# Patient Record
Sex: Female | Born: 1975 | Race: Black or African American | Hispanic: No | Smoking: Never smoker
Health system: Southern US, Community
[De-identification: ages and names within clinical notes are randomized; demographics above are authoritative.]

## PROBLEM LIST (undated history)

## (undated) DIAGNOSIS — D649 Anemia, unspecified: Secondary | ICD-10-CM

## (undated) DIAGNOSIS — Z789 Other specified health status: Secondary | ICD-10-CM

## (undated) HISTORY — DX: Anemia, unspecified: D64.9

## (undated) HISTORY — PX: COSMETIC SURGERY: SHX468

---

## 1997-08-18 HISTORY — PX: FOOT SURGERY: SHX648

## 1998-05-19 ENCOUNTER — Emergency Department (HOSPITAL_COMMUNITY): Admission: EM | Admit: 1998-05-19 | Discharge: 1998-05-19 | Payer: Self-pay | Admitting: Emergency Medicine

## 1998-07-07 ENCOUNTER — Emergency Department (HOSPITAL_COMMUNITY): Admission: EM | Admit: 1998-07-07 | Discharge: 1998-07-07 | Payer: Self-pay | Admitting: Emergency Medicine

## 1999-03-20 ENCOUNTER — Emergency Department (HOSPITAL_COMMUNITY): Admission: EM | Admit: 1999-03-20 | Discharge: 1999-03-20 | Payer: Self-pay | Admitting: Emergency Medicine

## 1999-10-22 ENCOUNTER — Other Ambulatory Visit: Admission: RE | Admit: 1999-10-22 | Discharge: 1999-10-22 | Payer: Self-pay | Admitting: Gynecology

## 2000-03-28 ENCOUNTER — Encounter: Payer: Self-pay | Admitting: Emergency Medicine

## 2000-03-28 ENCOUNTER — Emergency Department (HOSPITAL_COMMUNITY): Admission: EM | Admit: 2000-03-28 | Discharge: 2000-03-28 | Payer: Self-pay | Admitting: Emergency Medicine

## 2000-12-22 ENCOUNTER — Other Ambulatory Visit: Admission: RE | Admit: 2000-12-22 | Discharge: 2000-12-22 | Payer: Self-pay | Admitting: Obstetrics and Gynecology

## 2000-12-22 ENCOUNTER — Encounter (INDEPENDENT_AMBULATORY_CARE_PROVIDER_SITE_OTHER): Payer: Self-pay

## 2002-07-05 ENCOUNTER — Emergency Department (HOSPITAL_COMMUNITY): Admission: EM | Admit: 2002-07-05 | Discharge: 2002-07-05 | Payer: Self-pay | Admitting: Emergency Medicine

## 2002-08-25 ENCOUNTER — Encounter: Payer: Self-pay | Admitting: Emergency Medicine

## 2002-08-25 ENCOUNTER — Emergency Department (HOSPITAL_COMMUNITY): Admission: EM | Admit: 2002-08-25 | Discharge: 2002-08-25 | Payer: Self-pay | Admitting: Emergency Medicine

## 2002-09-03 ENCOUNTER — Encounter: Payer: Self-pay | Admitting: Chiropractor

## 2002-09-03 ENCOUNTER — Ambulatory Visit (HOSPITAL_COMMUNITY): Admission: RE | Admit: 2002-09-03 | Discharge: 2002-09-03 | Payer: Self-pay | Admitting: Chiropractor

## 2004-09-08 ENCOUNTER — Emergency Department (HOSPITAL_COMMUNITY): Admission: EM | Admit: 2004-09-08 | Discharge: 2004-09-08 | Payer: Self-pay | Admitting: Emergency Medicine

## 2005-03-06 ENCOUNTER — Other Ambulatory Visit: Admission: RE | Admit: 2005-03-06 | Discharge: 2005-03-06 | Payer: Self-pay | Admitting: Obstetrics and Gynecology

## 2009-04-12 ENCOUNTER — Emergency Department (HOSPITAL_BASED_OUTPATIENT_CLINIC_OR_DEPARTMENT_OTHER): Admission: EM | Admit: 2009-04-12 | Discharge: 2009-04-12 | Payer: Self-pay | Admitting: Emergency Medicine

## 2012-05-14 ENCOUNTER — Emergency Department (HOSPITAL_BASED_OUTPATIENT_CLINIC_OR_DEPARTMENT_OTHER)
Admission: EM | Admit: 2012-05-14 | Discharge: 2012-05-14 | Disposition: A | Discharge status: Home / Self Care | Payer: Self-pay | Attending: Emergency Medicine | Admitting: Emergency Medicine

## 2012-05-14 ENCOUNTER — Encounter (HOSPITAL_BASED_OUTPATIENT_CLINIC_OR_DEPARTMENT_OTHER): Payer: Self-pay | Admitting: *Deleted

## 2012-05-14 DIAGNOSIS — M549 Dorsalgia, unspecified: Secondary | ICD-10-CM | POA: Insufficient documentation

## 2012-05-14 MED ORDER — NAPROXEN 500 MG PO TABS: 500.0000 mg | ORAL_TABLET | Freq: Two times a day (BID) | ORAL | Status: DC

## 2012-05-14 MED ORDER — DIAZEPAM 5 MG PO TABS: ORAL_TABLET | ORAL | Status: DC

## 2012-05-14 MED ORDER — HYDROCODONE-ACETAMINOPHEN 5-325 MG PO TABS: ORAL_TABLET | ORAL | Status: DC

## 2012-05-14 MED ORDER — HYDROCODONE-ACETAMINOPHEN 5-325 MG PO TABS
2.0000 | ORAL_TABLET | Freq: Once | ORAL | Status: AC
  Administered 2012-05-14: 2 via ORAL
  Filled 2012-05-14: qty 2

## 2012-05-14 MED ORDER — DIAZEPAM 5 MG PO TABS
5.0000 mg | ORAL_TABLET | Freq: Once | ORAL | Status: AC
  Administered 2012-05-14: 5 mg via ORAL
  Filled 2012-05-14: qty 1

## 2012-05-14 NOTE — ED Notes (Signed)
Back pain. Running from a bird that got into her house this am and hurt her lower back.

## 2012-05-14 NOTE — ED Notes (Signed)
Pt was able to transfer herself from w/c to stretcher.
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
## 2012-05-17 NOTE — ED Provider Notes (Signed)
Medical screening examination/treatment/procedure(s) were performed by non-physician practitioner and as supervising physician I was immediately available for consultation/collaboration.  Geoffery Lyons, MD 05/17/12 1057

## 2013-09-06 ENCOUNTER — Encounter (HOSPITAL_COMMUNITY): Payer: Self-pay | Admitting: Pharmacist

## 2013-09-12 NOTE — H&P (Signed)
Tara Tate  DICTATION # [id number] CSN# [id number]   Margarette Asal, MD 09/12/2013 12:52 PM

## 2013-09-13 ENCOUNTER — Encounter (HOSPITAL_COMMUNITY)
Admission: RE | Admit: 2013-09-13 | Discharge: 2013-09-13 | Disposition: A | Discharge status: Home / Self Care | Payer: BC Managed Care – PPO | Source: Ambulatory Visit | Attending: Obstetrics and Gynecology | Admitting: Obstetrics and Gynecology

## 2013-09-13 ENCOUNTER — Encounter (INDEPENDENT_AMBULATORY_CARE_PROVIDER_SITE_OTHER): Payer: Self-pay

## 2013-09-13 ENCOUNTER — Encounter (HOSPITAL_COMMUNITY): Payer: Self-pay

## 2013-09-13 DIAGNOSIS — Z01810 Encounter for preprocedural cardiovascular examination: Secondary | ICD-10-CM | POA: Insufficient documentation

## 2013-09-13 DIAGNOSIS — Z01818 Encounter for other preprocedural examination: Secondary | ICD-10-CM | POA: Insufficient documentation

## 2013-09-13 HISTORY — DX: Other specified health status: Z78.9

## 2013-09-13 LAB — CBC
HEMATOCRIT: 34.1 % — AB (ref 36.0–46.0)
Hemoglobin: 11.4 g/dL — ABNORMAL LOW (ref 12.0–15.0)
MCH: 27 pg (ref 26.0–34.0)
MCHC: 33.4 g/dL (ref 30.0–36.0)
MCV: 80.6 fL (ref 78.0–100.0)
Platelets: 301 10*3/uL (ref 150–400)
RBC: 4.23 MIL/uL (ref 3.87–5.11)
RDW: 14.1 % (ref 11.5–15.5)
WBC: 5.3 10*3/uL (ref 4.0–10.5)

## 2013-09-13 NOTE — H&P (Signed)
NAMEBERNETTE, SEEMAN NO.:  [id number]  MEDICAL RECORD NO.:  [id number]  LOCATION:  PERIO                         FACILITY:  [facility]  PHYSICIAN:  Ralene Bathe. Matthew Saras, M.D.DATE OF BIRTH:  03-25-1976  DATE OF ADMISSION:  08/23/2013 DATE OF DISCHARGE:                             HISTORY & PHYSICAL   CHIEF COMPLAINT:  Pelvic pain, leiomyoma.  HISTORY OF PRESENT ILLNESS:  A 38 year old G6, P0, currently using condoms, I last saw this patient, began experiencing increased pelvic and back pain.  Part of her evaluation was felt to have irregularity of her uterus and had an ultrasound in our office, dated June 22, 2013, that showed a solitary 7 x 6.5 cm intramural fibroid, abutting the endometrium.  Ovaries appeared to be unremarkable.  No other significant fibroids were noted.  She presents at this time for myomectomy.  This procedure including specific risks related to bleeding, infection, adjacent organ injury, wound infection, phlebitis along with her expected recovery time discussed.  Concerns about fertility and the need for cesarean-section delivery, post myomectomy have all been discussed with her.  PAST MEDICAL HISTORY:  Allergies:  None. Current Medications:  Advil p.r.n.  REVIEW OF SYSTEMS:  Significant for headache, anemia, UTI.  FAMILY HISTORY:  Significant for heart disease, headache, anemia, epilepsy, UTI, diabetes, and hypertension.  PAST SURGICAL HISTORY:  None.  SOCIAL HISTORY:  Occasional alcohol use.  Denies drug or tobacco use. She is single.  Last Pap in October 2014, was normal.  PHYSICAL EXAMINATION:  VITAL SIGNS:  Temp 98.2, blood pressure 120/72. HEENT:  Unremarkable. NECK:  Supple without masses. LUNGS:  Clear. CARDIOVASCULAR:  Regular rate and rhythm without murmurs, rubs, or gallops noted. BREASTS:  Without masses. ABDOMEN:  Soft, flat, nontender. PELVIC:  Vulva, vagina, and cervix normal.  Uterus upper limit of normal size,  slightly tender on the left. EXTREMITIES:  Unremarkable. NEUROLOGIC:  Unremarkable.  IMPRESSION:  Solitary leiomyomas noted on ultrasound.  PLAN:  Myomectomy procedure and risks discussed as above.     Viktoria Gruetzmacher M. Matthew Saras, M.D.     [facility]/MEDQ  D:  09/12/2013  T:  09/13/2013  Job:  [id number]

## 2013-09-13 NOTE — Patient Instructions (Signed)
20 Tara Tate  09/13/2013   Your procedure is scheduled on:  09/19/13  Enter through the Main Entrance of [facility] at [place] up the phone at the desk and dial [phone].   Call this number if you have problems the morning of surgery: [phone]   Remember:   Do not eat food:After Midnight.  Do not drink clear liquids: After Midnight.  Take these medicines the morning of surgery with A SIP OF WATER: NA   Do not wear jewelry, make-up or nail polish.  Do not wear lotions, powders, or perfumes. You may wear deodorant.  Do not shave 48 hours prior to surgery.  Do not bring valuables to the hospital.  [facility] is not   responsible for any belongings or valuables brought to the hospital.  Contacts, dentures or bridgework may not be worn into surgery.  Leave suitcase in the car. After surgery it may be brought to your room.  For patients admitted to the hospital, checkout time is 11:00 AM the day of              discharge.   Patients discharged the day of surgery will not be allowed to drive             home.  Name and phone number of your driver: NA  Special Instructions:   Shower using CHG 2 nights before surgery and the night before surgery.  If you shower the day of surgery use CHG.  Use special wash - you have one bottle of CHG for all showers.  You should use approximately 1/3 of the bottle for each shower.   Please read over the following fact sheets that you were given:   Surgical Site Infection Prevention

## 2013-09-18 MED ORDER — CEFOTETAN DISODIUM 2 G IJ SOLR
2.0000 g | INTRAMUSCULAR | Status: AC
  Administered 2013-09-19: 2 g via INTRAVENOUS
  Filled 2013-09-18: qty 2

## 2013-09-18 NOTE — Anesthesia Preprocedure Evaluation (Addendum)
Anesthesia Evaluation  Patient identified by MRN, date of birth, ID band Patient awake    Reviewed: Allergy & Precautions, H&P , NPO status , Patient's Chart, lab work & pertinent test results  Airway Mallampati: II TM Distance: >3 FB Neck ROM: Full    Dental  (+) Teeth Intact and Dental Advisory Given   Pulmonary neg pulmonary ROS,  breath sounds clear to auscultation        Cardiovascular negative cardio ROS  Rhythm:Regular Rate:Normal     Neuro/Psych negative neurological ROS  negative psych ROS   GI/Hepatic negative GI ROS, Neg liver ROS,   Endo/Other  negative endocrine ROS  Renal/GU negative Renal ROS     Musculoskeletal negative musculoskeletal ROS (+)   Abdominal (+) + obese,   Peds  Hematology negative hematology ROS (+)   Anesthesia Other Findings   Reproductive/Obstetrics negative OB ROS                          Anesthesia Physical Anesthesia Plan  ASA: II  Anesthesia Plan: General   Post-op Pain Management:    Induction: Intravenous  Airway Management Planned: Oral ETT  Additional Equipment:   Intra-op Plan:   Post-operative Plan: Extubation in OR  Informed Consent: I have reviewed the patients History and Physical, chart, labs and discussed the procedure including the risks, benefits and alternatives for the proposed anesthesia with the patient or authorized representative who has indicated his/her understanding and acceptance.   Dental advisory given  Plan Discussed with: CRNA  Anesthesia Plan Comments:         Anesthesia Quick Evaluation

## 2013-09-19 ENCOUNTER — Encounter (HOSPITAL_COMMUNITY)
Admission: RE | Disposition: A | Discharge status: Home / Self Care | Payer: Self-pay | Source: Ambulatory Visit | Attending: Obstetrics and Gynecology

## 2013-09-19 ENCOUNTER — Encounter (HOSPITAL_COMMUNITY): Payer: BC Managed Care – PPO | Admitting: Anesthesiology

## 2013-09-19 ENCOUNTER — Inpatient Hospital Stay (HOSPITAL_COMMUNITY): Payer: BC Managed Care – PPO | Admitting: Anesthesiology

## 2013-09-19 ENCOUNTER — Encounter (HOSPITAL_COMMUNITY): Payer: Self-pay | Admitting: *Deleted

## 2013-09-19 ENCOUNTER — Observation Stay (HOSPITAL_COMMUNITY)
Admission: RE | Admit: 2013-09-19 | Discharge: 2013-09-21 | Disposition: A | Discharge status: Home / Self Care | Payer: BC Managed Care – PPO | Source: Ambulatory Visit | Attending: Obstetrics and Gynecology | Admitting: Obstetrics and Gynecology

## 2013-09-19 DIAGNOSIS — D251 Intramural leiomyoma of uterus: Secondary | ICD-10-CM | POA: Insufficient documentation

## 2013-09-19 DIAGNOSIS — N949 Unspecified condition associated with female genital organs and menstrual cycle: Principal | ICD-10-CM | POA: Insufficient documentation

## 2013-09-19 DIAGNOSIS — D219 Benign neoplasm of connective and other soft tissue, unspecified: Secondary | ICD-10-CM | POA: Diagnosis present

## 2013-09-19 HISTORY — PX: MYOMECTOMY: SHX85

## 2013-09-19 LAB — ABO/RH: ABO/RH(D): O POS

## 2013-09-19 LAB — TYPE AND SCREEN
ABO/RH(D): O POS
Antibody Screen: NEGATIVE

## 2013-09-19 LAB — PREGNANCY, URINE: Preg Test, Ur: NEGATIVE

## 2013-09-19 SURGERY — MYOMECTOMY, ABDOMINAL APPROACH
Anesthesia: General

## 2013-09-19 MED ORDER — BUPIVACAINE LIPOSOME 1.3 % IJ SUSP
20.0000 mL | Freq: Once | INTRAMUSCULAR | Status: AC
  Administered 2013-09-19: 20 mL
  Filled 2013-09-19: qty 20

## 2013-09-19 MED ORDER — HYDROMORPHONE HCL PF 1 MG/ML IJ SOLN
0.2500 mg | INTRAMUSCULAR | Status: DC | PRN
  Administered 2013-09-19 (×2): 0.5 mg via INTRAVENOUS

## 2013-09-19 MED ORDER — NEOSTIGMINE METHYLSULFATE 1 MG/ML IJ SOLN
INTRAMUSCULAR | Status: DC | PRN
  Administered 2013-09-19: 4 mg via INTRAVENOUS

## 2013-09-19 MED ORDER — ROCURONIUM BROMIDE 100 MG/10ML IV SOLN
INTRAVENOUS | Status: DC | PRN
  Administered 2013-09-19: 10 mg via INTRAVENOUS
  Administered 2013-09-19: 40 mg via INTRAVENOUS

## 2013-09-19 MED ORDER — PROMETHAZINE HCL 25 MG/ML IJ SOLN
INTRAMUSCULAR | Status: AC
  Filled 2013-09-19: qty 1

## 2013-09-19 MED ORDER — ONDANSETRON HCL 4 MG PO TABS: 4.0000 mg | ORAL_TABLET | Freq: Four times a day (QID) | ORAL | Status: DC | PRN

## 2013-09-19 MED ORDER — MORPHINE SULFATE (PF) 1 MG/ML IV SOLN
INTRAVENOUS | Status: DC
  Administered 2013-09-19: 10:00:00 via INTRAVENOUS
  Administered 2013-09-19: 18 mL via INTRAVENOUS
  Filled 2013-09-19: qty 25

## 2013-09-19 MED ORDER — SODIUM CHLORIDE 0.9 % IJ SOLN: 9.0000 mL | INTRAMUSCULAR | Status: DC | PRN

## 2013-09-19 MED ORDER — MIDAZOLAM HCL 2 MG/2ML IJ SOLN
INTRAMUSCULAR | Status: DC | PRN
  Administered 2013-09-19: 2 mg via INTRAVENOUS

## 2013-09-19 MED ORDER — GLYCOPYRROLATE 0.2 MG/ML IJ SOLN
INTRAMUSCULAR | Status: DC | PRN
  Administered 2013-09-19: .8 mg via INTRAVENOUS

## 2013-09-19 MED ORDER — FENTANYL CITRATE 0.05 MG/ML IJ SOLN
INTRAMUSCULAR | Status: AC
  Filled 2013-09-19: qty 5

## 2013-09-19 MED ORDER — OXYCODONE-ACETAMINOPHEN 5-325 MG PO TABS
1.0000 | ORAL_TABLET | ORAL | Status: DC | PRN
  Administered 2013-09-20 (×4): 1 via ORAL
  Administered 2013-09-21: 2 via ORAL
  Filled 2013-09-19 (×3): qty 1
  Filled 2013-09-19: qty 2
  Filled 2013-09-19: qty 1

## 2013-09-19 MED ORDER — KETOROLAC TROMETHAMINE 30 MG/ML IJ SOLN: 30.0000 mg | Freq: Four times a day (QID) | INTRAMUSCULAR | Status: DC

## 2013-09-19 MED ORDER — SODIUM CHLORIDE 0.9 % IJ SOLN
INTRAMUSCULAR | Status: AC
  Filled 2013-09-19: qty 10

## 2013-09-19 MED ORDER — DEXAMETHASONE SODIUM PHOSPHATE 10 MG/ML IJ SOLN
INTRAMUSCULAR | Status: DC | PRN
  Administered 2013-09-19: 10 mg via INTRAVENOUS

## 2013-09-19 MED ORDER — DEXAMETHASONE SODIUM PHOSPHATE 10 MG/ML IJ SOLN
INTRAMUSCULAR | Status: AC
  Filled 2013-09-19: qty 1

## 2013-09-19 MED ORDER — ONDANSETRON HCL 4 MG/2ML IJ SOLN: 4.0000 mg | Freq: Four times a day (QID) | INTRAMUSCULAR | Status: DC | PRN

## 2013-09-19 MED ORDER — BUTORPHANOL TARTRATE 1 MG/ML IJ SOLN: 1.0000 mg | INTRAMUSCULAR | Status: DC | PRN

## 2013-09-19 MED ORDER — PROMETHAZINE HCL 25 MG/ML IJ SOLN
6.2500 mg | INTRAMUSCULAR | Status: DC | PRN
  Administered 2013-09-19: 6.25 mg via INTRAVENOUS

## 2013-09-19 MED ORDER — DIPHENHYDRAMINE HCL 12.5 MG/5ML PO ELIX: 12.5000 mg | ORAL_SOLUTION | Freq: Four times a day (QID) | ORAL | Status: DC | PRN

## 2013-09-19 MED ORDER — METHYLENE BLUE 1 % INJ SOLN
INTRAMUSCULAR | Status: AC
  Filled 2013-09-19: qty 10

## 2013-09-19 MED ORDER — DIPHENHYDRAMINE HCL 50 MG/ML IJ SOLN
12.5000 mg | Freq: Four times a day (QID) | INTRAMUSCULAR | Status: DC | PRN
Start: 2013-09-19 — End: 2013-09-20

## 2013-09-19 MED ORDER — LIDOCAINE HCL (CARDIAC) 20 MG/ML IV SOLN
INTRAVENOUS | Status: DC | PRN
  Administered 2013-09-19: 50 mg via INTRAVENOUS

## 2013-09-19 MED ORDER — NALOXONE HCL 0.4 MG/ML IJ SOLN: 0.4000 mg | INTRAMUSCULAR | Status: DC | PRN

## 2013-09-19 MED ORDER — SODIUM CHLORIDE 0.9 % IJ SOLN
INTRAMUSCULAR | Status: AC
  Filled 2013-09-19: qty 100

## 2013-09-19 MED ORDER — PROPOFOL 10 MG/ML IV EMUL
INTRAVENOUS | Status: AC
  Filled 2013-09-19: qty 20

## 2013-09-19 MED ORDER — LIDOCAINE HCL (CARDIAC) 20 MG/ML IV SOLN
INTRAVENOUS | Status: AC
  Filled 2013-09-19: qty 5

## 2013-09-19 MED ORDER — DIPHENHYDRAMINE HCL 50 MG/ML IJ SOLN: 12.5000 mg | Freq: Four times a day (QID) | INTRAMUSCULAR | Status: DC | PRN

## 2013-09-19 MED ORDER — IBUPROFEN 800 MG PO TABS
800.0000 mg | ORAL_TABLET | Freq: Three times a day (TID) | ORAL | Status: DC | PRN
  Administered 2013-09-20 – 2013-09-21 (×3): 800 mg via ORAL
  Filled 2013-09-19 (×3): qty 1

## 2013-09-19 MED ORDER — KETOROLAC TROMETHAMINE 30 MG/ML IJ SOLN
30.0000 mg | Freq: Four times a day (QID) | INTRAMUSCULAR | Status: DC
  Administered 2013-09-19 – 2013-09-20 (×3): 30 mg via INTRAVENOUS
  Filled 2013-09-19 (×4): qty 1

## 2013-09-19 MED ORDER — LACTATED RINGERS IV SOLN: INTRAVENOUS | Status: DC | PRN

## 2013-09-19 MED ORDER — VASOPRESSIN 20 UNIT/ML IJ SOLN
INTRAMUSCULAR | Status: AC
  Filled 2013-09-19: qty 1

## 2013-09-19 MED ORDER — MIDAZOLAM HCL 2 MG/2ML IJ SOLN: 0.5000 mg | Freq: Once | INTRAMUSCULAR | Status: DC | PRN

## 2013-09-19 MED ORDER — HYDROMORPHONE HCL PF 1 MG/ML IJ SOLN
INTRAMUSCULAR | Status: AC
  Filled 2013-09-19: qty 1

## 2013-09-19 MED ORDER — KETOROLAC TROMETHAMINE 30 MG/ML IJ SOLN: 30.0000 mg | Freq: Once | INTRAMUSCULAR | Status: DC

## 2013-09-19 MED ORDER — LACTATED RINGERS IV SOLN
INTRAVENOUS | Status: DC
  Administered 2013-09-19 (×3): via INTRAVENOUS

## 2013-09-19 MED ORDER — HYDROMORPHONE HCL PF 1 MG/ML IJ SOLN
INTRAMUSCULAR | Status: AC
  Administered 2013-09-19: 0.5 mg via INTRAVENOUS
  Filled 2013-09-19: qty 1

## 2013-09-19 MED ORDER — BUPIVACAINE HCL (PF) 0.5 % IJ SOLN
INTRAMUSCULAR | Status: AC
  Filled 2013-09-19: qty 30

## 2013-09-19 MED ORDER — FENTANYL CITRATE 0.05 MG/ML IJ SOLN
INTRAMUSCULAR | Status: DC | PRN
  Administered 2013-09-19: 150 ug via INTRAVENOUS
  Administered 2013-09-19 (×2): 100 ug via INTRAVENOUS

## 2013-09-19 MED ORDER — ZOLPIDEM TARTRATE 5 MG PO TABS
5.0000 mg | ORAL_TABLET | Freq: Every evening | ORAL | Status: DC | PRN
  Administered 2013-09-20: 5 mg via ORAL
  Filled 2013-09-19: qty 1

## 2013-09-19 MED ORDER — ONDANSETRON HCL 4 MG/2ML IJ SOLN
INTRAMUSCULAR | Status: AC
  Filled 2013-09-19: qty 2

## 2013-09-19 MED ORDER — DEXTROSE IN LACTATED RINGERS 5 % IV SOLN
INTRAVENOUS | Status: DC
  Administered 2013-09-19: 13:00:00 via INTRAVENOUS

## 2013-09-19 MED ORDER — KETOROLAC TROMETHAMINE 30 MG/ML IJ SOLN
INTRAMUSCULAR | Status: DC | PRN
  Administered 2013-09-19: 30 mg via INTRAVENOUS

## 2013-09-19 MED ORDER — VASOPRESSIN 20 UNIT/ML IJ SOLN
INTRAVENOUS | Status: DC | PRN
  Administered 2013-09-19: 08:00:00 via INTRAMUSCULAR

## 2013-09-19 MED ORDER — ONDANSETRON HCL 4 MG/2ML IJ SOLN
INTRAMUSCULAR | Status: DC | PRN
  Administered 2013-09-19: 4 mg via INTRAVENOUS

## 2013-09-19 MED ORDER — MIDAZOLAM HCL 2 MG/2ML IJ SOLN
INTRAMUSCULAR | Status: AC
  Filled 2013-09-19: qty 2

## 2013-09-19 MED ORDER — PROPOFOL 10 MG/ML IV BOLUS
INTRAVENOUS | Status: DC | PRN
  Administered 2013-09-19: 180 mg via INTRAVENOUS

## 2013-09-19 MED ORDER — KETOROLAC TROMETHAMINE 30 MG/ML IJ SOLN: 15.0000 mg | Freq: Once | INTRAMUSCULAR | Status: DC | PRN

## 2013-09-19 MED ORDER — MORPHINE SULFATE (PF) 1 MG/ML IV SOLN
INTRAVENOUS | Status: DC
  Administered 2013-09-19: 5.51 mg via INTRAVENOUS
  Administered 2013-09-19: 1.5 mL via INTRAVENOUS
  Administered 2013-09-19: 7.5 mg via INTRAVENOUS
  Administered 2013-09-20: 9 mg via INTRAVENOUS
  Filled 2013-09-19: qty 25

## 2013-09-19 MED ORDER — MORPHINE SULFATE (PF) 1 MG/ML IV SOLN: INTRAVENOUS | Status: DC

## 2013-09-19 MED ORDER — MEPERIDINE HCL 25 MG/ML IJ SOLN: 6.2500 mg | INTRAMUSCULAR | Status: DC | PRN

## 2013-09-19 MED ORDER — MENTHOL 3 MG MT LOZG: 1.0000 | LOZENGE | OROMUCOSAL | Status: DC | PRN

## 2013-09-19 SURGICAL SUPPLY — 41 items
BARRIER ADHS 3X4 INTERCEED (GAUZE/BANDAGES/DRESSINGS) ×2 IMPLANT
BENZOIN TINCTURE PRP APPL 2/3 (GAUZE/BANDAGES/DRESSINGS) ×2 IMPLANT
CANISTER SUCT 3000ML (MISCELLANEOUS) ×2 IMPLANT
CATH FOLEY 2WAY  3CC  8FR (CATHETERS)
CATH FOLEY 2WAY 3CC 8FR (CATHETERS) IMPLANT
CATH KIT ON Q 5IN DUAL SLV (PAIN MANAGEMENT) IMPLANT
CLOTH BEACON ORANGE TIMEOUT ST (SAFETY) ×2 IMPLANT
CONT PATH 16OZ SNAP LID 3702 (MISCELLANEOUS) ×2 IMPLANT
DECANTER SPIKE VIAL GLASS SM (MISCELLANEOUS) ×2 IMPLANT
DRSG OPSITE POSTOP 4X10 (GAUZE/BANDAGES/DRESSINGS) ×2 IMPLANT
DRSG TEGADERM 2.38X2.75 (GAUZE/BANDAGES/DRESSINGS) IMPLANT
FILTER STRAW FLUID ASPIR (MISCELLANEOUS) IMPLANT
GAUZE SPONGE 4X4 16PLY XRAY LF (GAUZE/BANDAGES/DRESSINGS) ×4 IMPLANT
GLOVE BIO SURGEON STRL SZ7 (GLOVE) ×2 IMPLANT
GOWN STRL REUS W/TWL LRG LVL3 (GOWN DISPOSABLE) ×6 IMPLANT
IV STOPCOCK 4 WAY 40  W/Y SET (IV SOLUTION)
IV STOPCOCK 4 WAY 40 W/Y SET (IV SOLUTION) IMPLANT
NEEDLE HYPO 25X1 1.5 SAFETY (NEEDLE) ×2 IMPLANT
PACK ABDOMINAL GYN (CUSTOM PROCEDURE TRAY) ×2 IMPLANT
PAD OB MATERNITY 4.3X12.25 (PERSONAL CARE ITEMS) ×2 IMPLANT
RINGERS IRRIG 1000ML POUR BTL (IV SOLUTION) ×2 IMPLANT
RTRCTR WOUND ALEXIS 18CM SML (INSTRUMENTS) ×2
SAVER CELL AAL HAEMONETICS (INSTRUMENTS) ×1 IMPLANT
SPONGE GAUZE 4X4 12PLY STER LF (GAUZE/BANDAGES/DRESSINGS) ×4 IMPLANT
STRIP CLOSURE SKIN 1/2X4 (GAUZE/BANDAGES/DRESSINGS) ×2 IMPLANT
STRIP CLOSURE SKIN 1/4X3 (GAUZE/BANDAGES/DRESSINGS) ×2 IMPLANT
SUT MNCRL+ AB 3-0 CT1 36 (SUTURE) IMPLANT
SUT MON AB 3-0 SH 27 (SUTURE)
SUT MON AB 3-0 SH27 (SUTURE) IMPLANT
SUT MON AB 4-0 PS1 27 (SUTURE) ×2 IMPLANT
SUT MONOCRYL AB 3-0 CT1 36IN (SUTURE)
SUT PDS AB 0 CT1 27 (SUTURE) ×4 IMPLANT
SUT VIC AB 2-0 CT1 27 (SUTURE) ×4
SUT VIC AB 2-0 CT1 TAPERPNT 27 (SUTURE) ×2 IMPLANT
SYR 3ML 22GX1 1/2 SAFETY (SYRINGE) IMPLANT
SYR 50ML LL SCALE MARK (SYRINGE) IMPLANT
SYR 5ML LL (SYRINGE) ×2 IMPLANT
SYR CONTROL 10ML LL (SYRINGE) ×2 IMPLANT
TOWEL OR 17X24 6PK STRL BLUE (TOWEL DISPOSABLE) ×4 IMPLANT
TRAY FOLEY CATH 14FR (SET/KITS/TRAYS/PACK) ×2 IMPLANT
WATER STERILE IRR 1000ML POUR (IV SOLUTION) ×2 IMPLANT

## 2013-09-19 NOTE — Progress Notes (Signed)
Made introductions with pt and let her know of spiritual care services.  She has good support from family and is does not report any needs at this time.  [employer] Pager, [phone] 2:25 PM   09/19/13 1400  Clinical Encounter Type  Visited With Patient and family together  Visit Type Initial

## 2013-09-19 NOTE — Transfer of Care (Signed)
Immediate Anesthesia Transfer of Care Note  Patient: Tara Tate  Procedure(s) Performed: Procedure(s): MYOMECTOMY (N/A)  Patient Location: PACU  Anesthesia Type:General  Level of Consciousness: awake  Airway & Oxygen Therapy: Patient Spontanous Breathing  Post-op Assessment: Report given to PACU RN  Post vital signs: stable  Filed Vitals:   09/19/13 0615  BP: 121/73  Pulse: 66  Temp: 36.8 C  Resp: 18    Complications: No apparent anesthesia complications

## 2013-09-19 NOTE — Progress Notes (Signed)
The patient was re-examined with no change in status 

## 2013-09-19 NOTE — Anesthesia Postprocedure Evaluation (Signed)
  Anesthesia Post Note  Patient: Tara Tate  Procedure(s) Performed: Procedure(s) (LRB): MYOMECTOMY (N/A)  Anesthesia type: GA  Patient location: PACU  Post pain: Pain level controlled  Post assessment: Post-op Vital signs reviewed  Last Vitals:  Filed Vitals:   09/19/13 0930  BP:   Pulse:   Temp: 36.4 C  Resp:     Post vital signs: Reviewed  Level of consciousness: sedated  Complications: No apparent anesthesia complications

## 2013-09-19 NOTE — Anesthesia Postprocedure Evaluation (Signed)
  Anesthesia Post-op Note  Patient: Tara Tate  Procedure(s) Performed: Procedure(s): MYOMECTOMY (N/A)  Patient Location: Women's Unit  Anesthesia Type:General  Level of Consciousness: awake, alert  and oriented  Airway and Oxygen Therapy: Patient Spontanous Breathing and Patient connected to nasal cannula oxygen  Post-op Pain: none  Post-op Assessment: Post-op Vital signs reviewed and Patient's Cardiovascular Status Stable  Post-op Vital Signs: Reviewed and stable  Complications: No apparent anesthesia complications

## 2013-09-19 NOTE — Op Note (Signed)
Preoperative diagnosis: Symptomatic leiomyoma, pelvic pain  Postoperative diagnosis: Same   Procedure: Laparotomy with myomectomy  Surgeon: Matthew Saras  Assistant: Morris  EBL: 150 cc  Specimens removed: Solitary fibroid, to pathology  Procedure and findings:  The patient taken the operating room after an adequate level of general anesthesia was obtained with the patient in the supine position the abdomen prepped and draped in the usual fashion, Foley catheter positioned. Appropriate timeout taken at that point.  Penicillus is was made 2 finger breaths above the symphysis carried down to the fascia which was incised and extended transversely. Rectus muscles were divided in the midline, peritoneum entered superiorly without incident and extended in a vertical fashion. Alexis retractor was positioned the bowels were packed superiorly out of the field a large bilobed 9 cm fibroid at the fundus was noted bilateral tubes and ovaries cul-de-sac and anterior space were free and clear no other fibroids were noted.  A cockup was used to grasp the large fibroid to elevate the uterus out of the incision, dilute Pitressin solution was injected incision was made through the serosa the fibroid was then enucleated and removed. This did not appear 10 [place] on either tube nor on the endometrium is best I can tell. Thank you was superior to the cavity. Deeper tissue closed with 0 Monocryl suture interrupted. Serosa was then closed the running 4-0 Monocryl locked suture with excellent hemostasis. After irrigation, the operative site was noted be hemostatic Interceed was placed, prior to closure sponge, needle, instrument counts reported as correct x2. The retractor was removed. Peritoneum closed with a running 2-0 Vicryl suture. 0 PDS was then used from laterally to midline on either side to close the fascia undiluted Exparel was then injected into the subcutaneous tissue for postoperative analgesia. Subcutaneous tissue  was hemostatic a 4-0 Monocryl subcuticular closure with a honeycomb dressing. She tolerated this well went to recovery room in good condition.  Dictated with [facility]  Tara Borba M. Tara Heater.D.

## 2013-09-20 ENCOUNTER — Encounter (HOSPITAL_COMMUNITY): Payer: Self-pay | Admitting: Obstetrics and Gynecology

## 2013-09-20 LAB — CBC
HCT: 31.1 % — ABNORMAL LOW (ref 36.0–46.0)
Hemoglobin: 10.3 g/dL — ABNORMAL LOW (ref 12.0–15.0)
MCH: 26.5 pg (ref 26.0–34.0)
MCHC: 33.1 g/dL (ref 30.0–36.0)
MCV: 79.9 fL (ref 78.0–100.0)
PLATELETS: 344 10*3/uL (ref 150–400)
RBC: 3.89 MIL/uL (ref 3.87–5.11)
RDW: 14.2 % (ref 11.5–15.5)
WBC: 15.3 10*3/uL — ABNORMAL HIGH (ref 4.0–10.5)

## 2013-09-20 NOTE — Progress Notes (Signed)
1 Day Post-Op Procedure(s) (LRB): MYOMECTOMY (N/A)  Subjective: Patient reports tolerating PO.    Objective: I have reviewed patient's vital signs and labs. BP 107/68  Pulse 63  Temp(Src) 98.3 F (36.8 C) (Oral)  Resp 18  Ht 5\' 6"  (1.676 m)  Wt 94.802 kg (209 lb)  BMI 33.75 kg/m2  SpO2 97% CBC    Component Value Date/Time   WBC 15.3* 09/20/2013 0558   RBC 3.89 09/20/2013 0558   HGB 10.3* 09/20/2013 0558   HCT 31.1* 09/20/2013 0558   PLT 344 09/20/2013 0558   MCV 79.9 09/20/2013 0558   MCH 26.5 09/20/2013 0558   MCHC 33.1 09/20/2013 0558   RDW 14.2 09/20/2013 0558      abd soft + BS, Inc C/D  Assessment: s/p Procedure(s): MYOMECTOMY (N/A): stable  Plan: Advance diet Encourage ambulation Advance to PO medication Discontinue IV fluids  LOS: 1 day    Keeon Zurn M 09/20/2013, 8:54 AM

## 2013-09-21 MED ORDER — OXYCODONE-ACETAMINOPHEN 5-325 MG PO TABS: 1.0000 | ORAL_TABLET | ORAL | Status: DC | PRN

## 2013-09-21 MED ORDER — IBUPROFEN 800 MG PO TABS: 800.0000 mg | ORAL_TABLET | Freq: Three times a day (TID) | ORAL | Status: DC | PRN

## 2013-09-21 NOTE — Discharge Summary (Signed)
Physician Discharge Summary  Patient ID: Tara Tate MRN: [id number] DOB/AGE: 03/09/1976 38 y.o.  Admit date: 09/19/2013 Discharge date: 09/21/2013  Admission Ida pain/leiomyoma  Discharge Diagnoses: same Active Problems:   Leiomyoma   Discharged Condition: good  Hospital Course: adm for EL with solitary removal of large leiomyoma, DC on POD 2, afeb, tol PO , amb w/o probs  Consults: None  Significant Diagnostic Studies: labs:  CBC    Component Value Date/Time   WBC 15.3* 09/20/2013 0558   RBC 3.89 09/20/2013 0558   HGB 10.3* 09/20/2013 0558   HCT 31.1* 09/20/2013 0558   PLT 344 09/20/2013 0558   MCV 79.9 09/20/2013 0558   MCH 26.5 09/20/2013 0558   MCHC 33.1 09/20/2013 0558   RDW 14.2 09/20/2013 0558      Treatments: surgery: myomectomy  Discharge Exam: Blood pressure 114/74, pulse 72, temperature 98.3 F (36.8 C), temperature source Oral, resp. rate 16, height 5\' 6"  (1.676 m), weight 94.802 kg (209 lb), SpO2 97.00%. GI: soft, non-tender; bowel sounds normal; no masses,  no organomegaly  Inc C/D Disposition: 01-Home or Self Care     Medication List    STOP taking these medications       aspirin-acetaminophen-caffeine 250-250-65 MG per tablet  Commonly known as:  EXCEDRIN MIGRAINE     HYDROcodone-acetaminophen 5-325 MG per tablet  Commonly known as:  NORCO/VICODIN      TAKE these medications       clotrimazole-betamethasone cream  Commonly known as:  LOTRISONE  Apply 1 application topically daily. Apply to Hand for Rash.     ibuprofen 800 MG tablet  Commonly known as:  ADVIL,MOTRIN  Take 1 tablet (800 mg total) by mouth every 8 (eight) hours as needed (mild pain).     multivitamin with minerals Tabs tablet  Take 1 tablet by mouth daily. Uses Abcuts Smart Gummy     oxyCODONE-acetaminophen 5-325 MG per tablet  Commonly known as:  PERCOCET/ROXICET  Take 1-2 tablets by mouth every 4 (four) hours as needed for severe pain (moderate to severe pain (when  tolerating fluids)).     phentermine 37.5 MG capsule  Take 37.5 mg by mouth every morning.           Follow-up Information   Follow up with Margarette Asal, MD. Schedule an appointment as soon as possible for a visit in 1 week.   Specialty:  Obstetrics and Gynecology   Contact information:   [address] [place] [address] [phone]       Signed: Margarette Asal 09/21/2013, 7:53 AM

## 2014-06-26 ENCOUNTER — Other Ambulatory Visit: Payer: Self-pay | Admitting: Obstetrics and Gynecology

## 2014-06-27 LAB — CYTOLOGY - PAP

## 2016-09-22 DIAGNOSIS — Z0000 Encounter for general adult medical examination without abnormal findings: Secondary | ICD-10-CM | POA: Diagnosis not present

## 2016-09-25 DIAGNOSIS — M545 Low back pain: Secondary | ICD-10-CM | POA: Diagnosis not present

## 2016-09-25 DIAGNOSIS — M4316 Spondylolisthesis, lumbar region: Secondary | ICD-10-CM | POA: Diagnosis not present

## 2016-09-25 DIAGNOSIS — M4846XA Fatigue fracture of vertebra, lumbar region, initial encounter for fracture: Secondary | ICD-10-CM | POA: Diagnosis not present

## 2016-10-07 DIAGNOSIS — M4846XA Fatigue fracture of vertebra, lumbar region, initial encounter for fracture: Secondary | ICD-10-CM | POA: Diagnosis not present

## 2016-10-10 DIAGNOSIS — M545 Low back pain: Secondary | ICD-10-CM | POA: Diagnosis not present

## 2016-10-20 DIAGNOSIS — M545 Low back pain: Secondary | ICD-10-CM | POA: Diagnosis not present

## 2016-11-04 DIAGNOSIS — E669 Obesity, unspecified: Secondary | ICD-10-CM | POA: Diagnosis not present

## 2016-11-21 DIAGNOSIS — N898 Other specified noninflammatory disorders of vagina: Secondary | ICD-10-CM | POA: Diagnosis not present

## 2016-11-21 DIAGNOSIS — B9689 Other specified bacterial agents as the cause of diseases classified elsewhere: Secondary | ICD-10-CM | POA: Diagnosis not present

## 2016-11-21 DIAGNOSIS — N760 Acute vaginitis: Secondary | ICD-10-CM | POA: Diagnosis not present

## 2017-04-17 DIAGNOSIS — M62838 Other muscle spasm: Secondary | ICD-10-CM | POA: Diagnosis not present

## 2017-04-17 DIAGNOSIS — J069 Acute upper respiratory infection, unspecified: Secondary | ICD-10-CM | POA: Diagnosis not present

## 2017-05-27 DIAGNOSIS — F4322 Adjustment disorder with anxiety: Secondary | ICD-10-CM | POA: Diagnosis not present

## 2017-06-01 DIAGNOSIS — Z01419 Encounter for gynecological examination (general) (routine) without abnormal findings: Secondary | ICD-10-CM | POA: Diagnosis not present

## 2017-06-01 DIAGNOSIS — Z6834 Body mass index (BMI) 34.0-34.9, adult: Secondary | ICD-10-CM | POA: Diagnosis not present

## 2017-06-02 ENCOUNTER — Other Ambulatory Visit (HOSPITAL_COMMUNITY): Payer: Self-pay | Admitting: Obstetrics and Gynecology

## 2017-06-02 DIAGNOSIS — Z3141 Encounter for fertility testing: Secondary | ICD-10-CM

## 2017-06-10 ENCOUNTER — Ambulatory Visit (HOSPITAL_COMMUNITY)
Admission: RE | Admit: 2017-06-10 | Discharge: 2017-06-10 | Disposition: A | Discharge status: Home / Self Care | Payer: BLUE CROSS/BLUE SHIELD | Source: Ambulatory Visit | Attending: Obstetrics and Gynecology | Admitting: Obstetrics and Gynecology

## 2017-06-10 DIAGNOSIS — Z3141 Encounter for fertility testing: Secondary | ICD-10-CM | POA: Insufficient documentation

## 2017-06-10 MED ORDER — IOPAMIDOL (ISOVUE-300) INJECTION 61%
30.0000 mL | Freq: Once | INTRAVENOUS | Status: AC | PRN
  Administered 2017-06-10: 18 mL

## 2017-06-23 DIAGNOSIS — N979 Female infertility, unspecified: Secondary | ICD-10-CM | POA: Diagnosis not present

## 2018-02-03 DIAGNOSIS — N898 Other specified noninflammatory disorders of vagina: Secondary | ICD-10-CM | POA: Diagnosis not present

## 2018-02-03 DIAGNOSIS — N760 Acute vaginitis: Secondary | ICD-10-CM | POA: Diagnosis not present

## 2018-02-03 DIAGNOSIS — B9689 Other specified bacterial agents as the cause of diseases classified elsewhere: Secondary | ICD-10-CM | POA: Diagnosis not present

## 2018-03-26 DIAGNOSIS — L308 Other specified dermatitis: Secondary | ICD-10-CM | POA: Diagnosis not present

## 2018-06-30 ENCOUNTER — Encounter: Payer: Self-pay | Admitting: Family Medicine

## 2018-06-30 ENCOUNTER — Ambulatory Visit (INDEPENDENT_AMBULATORY_CARE_PROVIDER_SITE_OTHER): Payer: BLUE CROSS/BLUE SHIELD | Admitting: Family Medicine

## 2018-06-30 ENCOUNTER — Other Ambulatory Visit: Payer: Self-pay

## 2018-06-30 VITALS — BP 125/84 | HR 81 | Temp 97.0°F | Ht 66.0 in | Wt 211.2 lb

## 2018-06-30 DIAGNOSIS — Z111 Encounter for screening for respiratory tuberculosis: Secondary | ICD-10-CM

## 2018-06-30 MED ORDER — TUBERCULIN PPD 5 UNIT/0.1ML ID SOLN
5.0000 [IU] | Freq: Once | INTRADERMAL | Status: AC
  Administered 2018-06-30: 5 [IU] via INTRADERMAL

## 2018-06-30 NOTE — Progress Notes (Signed)
   11/13/201911:27 AM  Tara Tate [birth year], 42 y.o. female [id number]  Chief Complaint  Patient presents with  . 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 Shot is needed for this purpose    HPI:   Patient is a 42 y.o. female who presents today for TB skin test required for employment  Reports past TB skin tests in the past, about 15-16 years ago Denies any positive TB test Denies any exposures Denies any international travels Denies any night sweats, coughing up blood, cough  No flowsheet data found.   No flowsheet data found.  No Known Allergies  Prior to Admission medications   Medication Sig Start Date End Date Taking? Authorizing Provider  clotrimazole-betamethasone (LOTRISONE) cream Apply 1 application topically daily. Apply to Hand for Rash.   Yes [provider]  Multiple Vitamin (MULTIVITAMIN WITH MINERALS) TABS tablet Take 1 tablet by mouth daily. Uses Abcuts Smart Gummy   Yes [provider]    Past Medical History:  Diagnosis Date  . Anemia   . Medical history non-contributory     Past Surgical History:  Procedure Laterality Date  . COSMETIC SURGERY    . FOOT SURGERY Bilateral 1999  . MYOMECTOMY N/A 09/19/2013   Procedure: MYOMECTOMY;  Surgeon: Margarette Asal, MD;  Location: [facility];  Service: Gynecology;  Laterality: N/A;    Social History   Tobacco Use  . Smoking status: Never Smoker  . Smokeless tobacco: Never Used  Substance Use Topics  . Alcohol use: Yes    Comment: occasional    Family History  Problem Relation Age of Onset  . Hyperlipidemia Mother   . Hypertension Mother   . Diabetes Mother   . Diabetes Maternal Grandmother   . Heart disease Maternal Grandmother   . Hyperlipidemia Maternal Grandmother   . Diabetes Maternal Grandfather   . Hyperlipidemia Maternal Grandfather   . Heart disease Maternal Grandfather     ROS Per hpi  OBJECTIVE:  Blood pressure 125/84,  pulse 81, temperature (!) 97 F (36.1 C), temperature source Oral, height 5\' 6"  (1.676 m), weight 211 lb 3.2 oz (95.8 kg), last menstrual period 06/27/2018, SpO2 97 %. Body mass index is 34.09 kg/m.   Physical Exam  Constitutional: She is oriented to person, place, and time. She appears well-developed and well-nourished.  HENT:  Head: Normocephalic and atraumatic.  Mouth/Throat: Oropharynx is clear and moist. No oropharyngeal exudate.  Eyes: Pupils are equal, round, and reactive to light. Conjunctivae and EOM are normal. No scleral icterus.  Neck: Neck supple.  Cardiovascular: Normal rate, regular rhythm and normal heart sounds. Exam reveals no gallop and no friction rub.  No murmur heard. Pulmonary/Chest: Effort normal and breath sounds normal. She has no wheezes. She has no rales.  Musculoskeletal: She exhibits no edema.  Neurological: She is alert and oriented to person, place, and time.  Skin: Skin is warm and dry.  Psychiatric: She has a normal mood and affect.  Nursing note and vitals reviewed.    ASSESSMENT and PLAN  1. Screening-pulmonary TB See AVS for patient instructions - tuberculin injection 5 Units  Return if symptoms worsen or fail to improve.    Rutherford Guys, MD [facility] [place], [place] [address] Ph.  [phone] Fax [phone]

## 2018-06-30 NOTE — Patient Instructions (Addendum)
Tuberculosis Risk Questionnaire  1no  Were you born outside the [place] in one of the following parts of the world: [place], [place], [place], [place] or [place]?    2. No Have you traveled outside the [place] and lived for more than one month in one of the following parts of the world: [place], [place], [place], [place] or [place]?    3. No Do you have a compromised immune system such as from any of the following conditions:HIV/AIDS, organ or bone marrow transplantation, diabetes, immunosuppressive medicines (e.g. Prednisone, Remicaide), leukemia, lymphoma, cancer of the head or neck, gastrectomy or jejunal bypass, end-stage renal disease (on dialysis), or silicosis?     4. No Have you ever or do you plan on working in: a residential care center, a health care facility, a jail or prison or homeless shelter?    5. No Have you ever: injected illegal drugs, used crack cocaine, lived in a homeless shelter  or been in jail or prison?     6. No Have you ever been exposed to anyone with infectious tuberculosis?  7. No Have you ever had a BCG vaccine? (BCG is a vaccine for tuberculosis  (TB) used in OTHER countries, NOT in the [place]).  8. No Have you ever been advised by a health care provider NOT to have a TB skin test?  9. No Have you ever had a POSITIVE TB skin test?  IF SO, when? n/a  IF SO, were you treated with INH? n/a  IF SO, where? n/a  Tuberculosis Symptom Questionnaire  Do you currently have any of the following symptoms?  1. No Unexplained cough lasting more than 3 weeks?   2. No Unexplained fever lasting more than 3 weeks.   3. No Night Sweats (sweating that leaves the bedclothes and sheets wet)     4. No Shortness of Breath   5. No Chest Pain   6. No Unintentional weight loss    7. No Unexplained fatigue (very tired for no reason)      If you have lab work done today you will be contacted with your lab results within the next 2 weeks.   If you have not heard from [place] then please contact us. The fastest way to get your results is to register for My Chart.   IF you received an x-ray today, you will receive an invoice from [facility]. Please contact [facility] at [phone] with questions or concerns regarding your invoice.   IF you received labwork today, you will receive an invoice from [place]. Please contact [facility] at [phone] with questions or concerns regarding your invoice.   Our billing staff will not be able to assist you with questions regarding bills from these companies.  You will be contacted with the lab results as soon as they are available. The fastest way to get your results is to activate your My Chart account. Instructions are located on the last page of this paperwork. If you have not heard from [place] regarding the results in 2 weeks, please contact this office.        If you have lab work done today you will be contacted with your lab results within the next 2 weeks.  If you have not heard from [place] then please contact us. The fastest way to get your results is to register for My Chart.   IF you received an x-ray today, you will receive an invoice from [facility]. Please contact  [facility] at [phone] with questions or concerns regarding your invoice.   IF you received labwork today, you will receive an invoice from [place]. Please contact [facility] at [phone] with questions or concerns regarding your invoice.   Our billing staff will not be able to assist you with questions regarding bills from these companies.  You will be contacted with the lab results as soon as they are available. The fastest way to get your results is to activate your My Chart account. Instructions are located on the last page of this paperwork. If you have not heard from [place] regarding the results in 2 weeks, please contact this office.    Please return on Friday afternoon for  reading. Please bring in your paperwork for [place] to be able to complete documentation

## 2018-07-02 ENCOUNTER — Ambulatory Visit: Payer: BLUE CROSS/BLUE SHIELD

## 2018-07-29 ENCOUNTER — Ambulatory Visit: Payer: BLUE CROSS/BLUE SHIELD | Admitting: Physician Assistant

## 2018-07-29 DIAGNOSIS — R51 Headache: Secondary | ICD-10-CM | POA: Diagnosis not present

## 2018-08-16 DIAGNOSIS — Z1389 Encounter for screening for other disorder: Secondary | ICD-10-CM | POA: Diagnosis not present

## 2018-08-16 DIAGNOSIS — G43009 Migraine without aura, not intractable, without status migrainosus: Secondary | ICD-10-CM | POA: Diagnosis not present

## 2018-08-16 DIAGNOSIS — D649 Anemia, unspecified: Secondary | ICD-10-CM | POA: Diagnosis not present

## 2018-08-16 DIAGNOSIS — Z0000 Encounter for general adult medical examination without abnormal findings: Secondary | ICD-10-CM | POA: Diagnosis not present

## 2018-08-16 DIAGNOSIS — Z13220 Encounter for screening for lipoid disorders: Secondary | ICD-10-CM | POA: Diagnosis not present

## 2018-08-16 DIAGNOSIS — Z1329 Encounter for screening for other suspected endocrine disorder: Secondary | ICD-10-CM | POA: Diagnosis not present

## 2018-08-22 DIAGNOSIS — J0300 Acute streptococcal tonsillitis, unspecified: Secondary | ICD-10-CM | POA: Diagnosis not present

## 2018-08-23 ENCOUNTER — Other Ambulatory Visit: Payer: Self-pay

## 2018-08-23 ENCOUNTER — Encounter (HOSPITAL_BASED_OUTPATIENT_CLINIC_OR_DEPARTMENT_OTHER): Payer: Self-pay | Admitting: *Deleted

## 2018-08-23 DIAGNOSIS — Z79899 Other long term (current) drug therapy: Secondary | ICD-10-CM | POA: Insufficient documentation

## 2018-08-23 DIAGNOSIS — R040 Epistaxis: Secondary | ICD-10-CM | POA: Insufficient documentation

## 2018-08-23 NOTE — ED Triage Notes (Signed)
Pt c/o nose bleed x 1 hr

## 2018-08-24 ENCOUNTER — Emergency Department (HOSPITAL_BASED_OUTPATIENT_CLINIC_OR_DEPARTMENT_OTHER)
Admission: EM | Admit: 2018-08-24 | Discharge: 2018-08-24 | Disposition: A | Discharge status: Home / Self Care | Payer: BLUE CROSS/BLUE SHIELD | Attending: Emergency Medicine | Admitting: Emergency Medicine

## 2018-08-24 DIAGNOSIS — R040 Epistaxis: Secondary | ICD-10-CM

## 2018-08-24 MED ORDER — OXYMETAZOLINE HCL 0.05 % NA SOLN
2.0000 | NASAL | Status: DC | PRN
  Administered 2018-08-24: 2 via NASAL
  Filled 2018-08-24: qty 15

## 2018-08-24 NOTE — ED Provider Notes (Signed)
[facility] [facility] Provider Note: Georgena Spurling, MD, FACEP  CSN: [id number] MRN: [id number] ARRIVAL: 08/23/18 at 2329 ROOM: [place]  Epistaxis   HISTORY OF PRESENT ILLNESS  08/24/18 1:14 AM Tara Tate is a 43 y.o. female with a sore throat for the past 4 days.  2 days ago she was started on amoxicillin and prednisone for strep throat which was diagnosed clinically, not with a strep test.  She is here with "really bad" nosebleed that began about an hour prior to arrival.  It has subsequently subsided with pressure.  She denies associated pain.  She did feel dizzy earlier.    Past Medical History:  Diagnosis Date  . Anemia   . Medical history non-contributory     Past Surgical History:  Procedure Laterality Date  . COSMETIC SURGERY    . FOOT SURGERY Bilateral 1999  . MYOMECTOMY N/A 09/19/2013   Procedure: MYOMECTOMY;  Surgeon: Margarette Asal, MD;  Location: [facility];  Service: Gynecology;  Laterality: N/A;    Family History  Problem Relation Age of Onset  . Hyperlipidemia Mother   . Hypertension Mother   . Diabetes Mother   . Diabetes Maternal Grandmother   . Heart disease Maternal Grandmother   . Hyperlipidemia Maternal Grandmother   . Diabetes Maternal Grandfather   . Hyperlipidemia Maternal Grandfather   . Heart disease Maternal Grandfather     Social History   Tobacco Use  . Smoking status: Never Smoker  . Smokeless tobacco: Never Used  Substance Use Topics  . Alcohol use: Yes    Comment: occasional  . Drug use: No    Prior to Admission medications   Medication Sig Start Date End Date Taking? Authorizing Provider  amoxicillin (AMOXIL) 500 MG capsule Take 500 mg by mouth 3 (three) times daily.   Yes [provider]  predniSONE (DELTASONE) 10 MG tablet Take 10 mg by mouth daily with breakfast.   Yes [provider]  clotrimazole-betamethasone (LOTRISONE) cream Apply 1 application topically daily. Apply to Hand  for Rash.    [provider]  Multiple Vitamin (MULTIVITAMIN WITH MINERALS) TABS tablet Take 1 tablet by mouth daily. Uses Abcuts Smart Gummy    [provider]    Allergies Patient has no known allergies.   REVIEW OF SYSTEMS  Negative except as noted here or in the History of Present Illness.   PHYSICAL EXAMINATION  Initial Vital Signs Blood pressure (!) 146/95, pulse 63, temperature 98.1 F (36.7 C), resp. rate 18, height 5\' 6"  (1.676 m), weight 95.3 kg, last menstrual period 07/23/2018, SpO2 99 %.  Examination General: Well-developed, well-nourished female in no acute distress; appearance consistent with age of record HENT: normocephalic; atraumatic; mild tonsillar enlargement without erythema or exudate; dried blood left nasal septum without active bleeding Eyes: pupils equal, round and reactive to light; extraocular muscles intact Neck: supple Heart: regular rate and rhythm Lungs: clear to auscultation bilaterally Abdomen: soft; nondistended; nontender; bowel sounds present Extremities: No deformity; full range of motion Neurologic: Awake, alert and oriented; motor function intact in all extremities and symmetric; no facial droop Skin: Warm and dry Psychiatric: Normal mood and affect   RESULTS  Summary of this visit's results, reviewed by myself:   EKG Interpretation  Date/Time:    Ventricular Rate:    PR Interval:    QRS Duration:   QT Interval:    QTC Calculation:   R Axis:     Text Interpretation:  Laboratory Studies: No results found for this or any previous visit (from the past 24 hour(s)). Imaging Studies: No results found.  ED COURSE and MDM  Nursing notes and initial vitals signs, including pulse oximetry, reviewed.  Vitals:   08/23/18 2337 08/23/18 2338  BP: (!) 146/95   Pulse: 63   Resp: 18   Temp: 98.1 F (36.7 C)   SpO2: 99%   Weight:  95.3 kg  Height:  5\' 6"  (1.676 m)   1:40 AM Patient continues to be  hemostatic.  Given Afrin for use as needed.  She was advised to discontinue prednisone as this may be encouraging bleeding.  PROCEDURES    ED DIAGNOSES     ICD-10-CM   1. Anterior epistaxis R04.0        Farris Geiman, Jenny Reichmann, MD 08/24/18 0140

## 2018-08-24 NOTE — ED Notes (Signed)
Pt verbalizes understanding of d/c instructions and denies any further needs at this time. 

## 2019-01-15 DIAGNOSIS — B001 Herpesviral vesicular dermatitis: Secondary | ICD-10-CM | POA: Diagnosis not present

## 2019-01-24 DIAGNOSIS — N760 Acute vaginitis: Secondary | ICD-10-CM | POA: Diagnosis not present

## 2019-01-24 DIAGNOSIS — Z113 Encounter for screening for infections with a predominantly sexual mode of transmission: Secondary | ICD-10-CM | POA: Diagnosis not present

## 2019-01-24 DIAGNOSIS — Z01419 Encounter for gynecological examination (general) (routine) without abnormal findings: Secondary | ICD-10-CM | POA: Diagnosis not present

## 2019-01-24 DIAGNOSIS — Z6834 Body mass index (BMI) 34.0-34.9, adult: Secondary | ICD-10-CM | POA: Diagnosis not present

## 2019-05-06 IMAGING — RF DG HYSTEROGRAM
3 series · 3 of 3 positions shown · non-contrast
Comparison: None.

CLINICAL DATA: Fertility testing.

EXAM:
HYSTEROSALPINGOGRAM
TECHNIQUE: Hysterosalpingogram was performed by the ordering physician under
fluoroscopy. Fluoroscopic images were submitted for radiologic
interpretation following the procedure. Please see the procedural
report for the amount of contrast and the fluoroscopy time utilized.

[Series 1: run · 1 of 1 slices shown (1 of 3)]
[im 1/1]
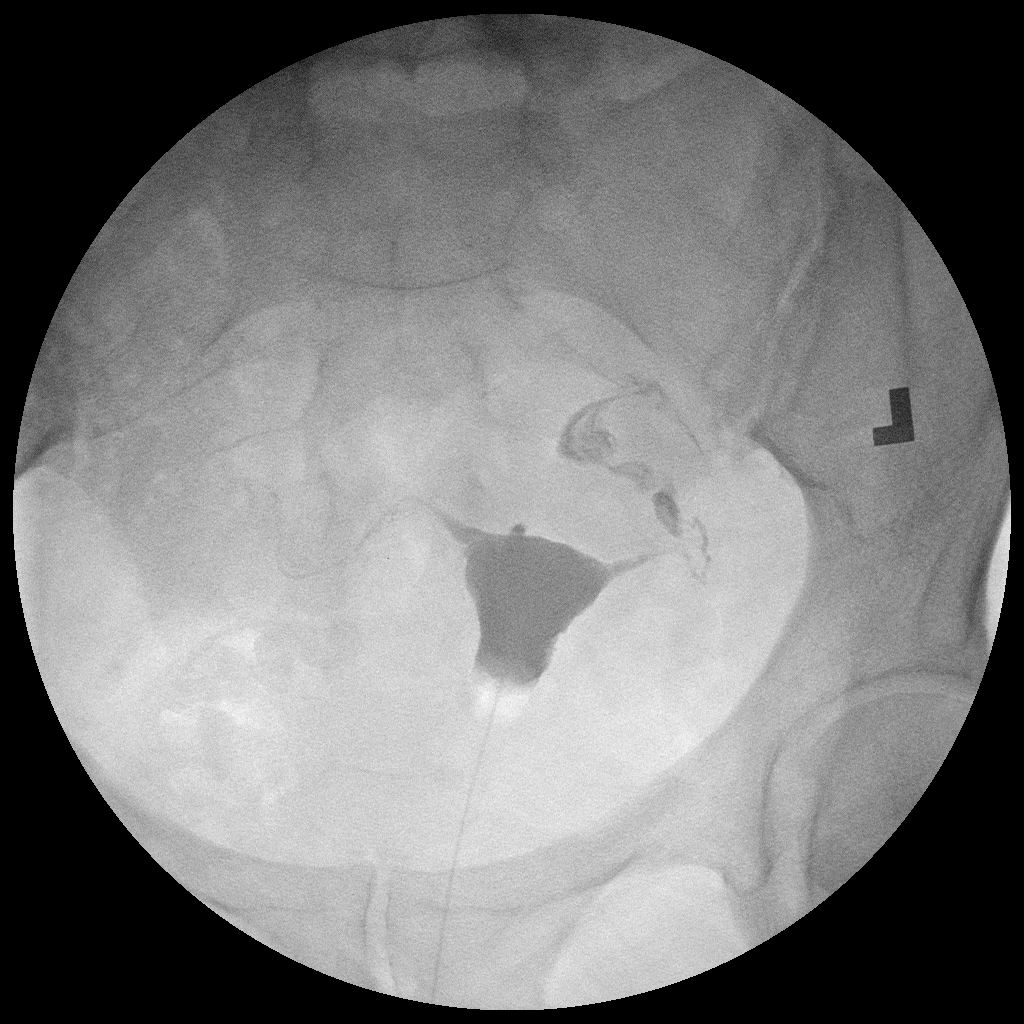

[Series 2: run · 1 of 1 slices shown (2 of 3)]
[im 1/1]
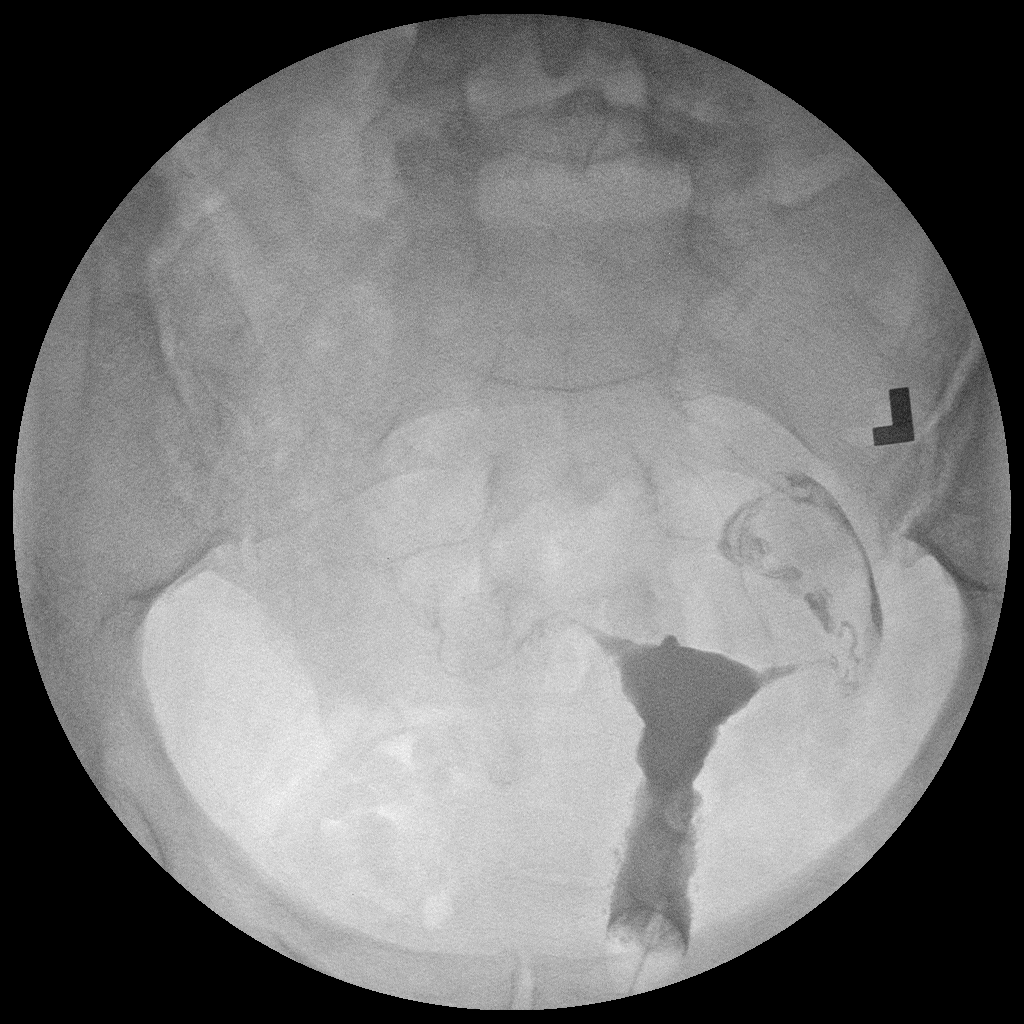

[Series 3: run · 1 of 1 slices shown (3 of 3)]
[im 1/1]
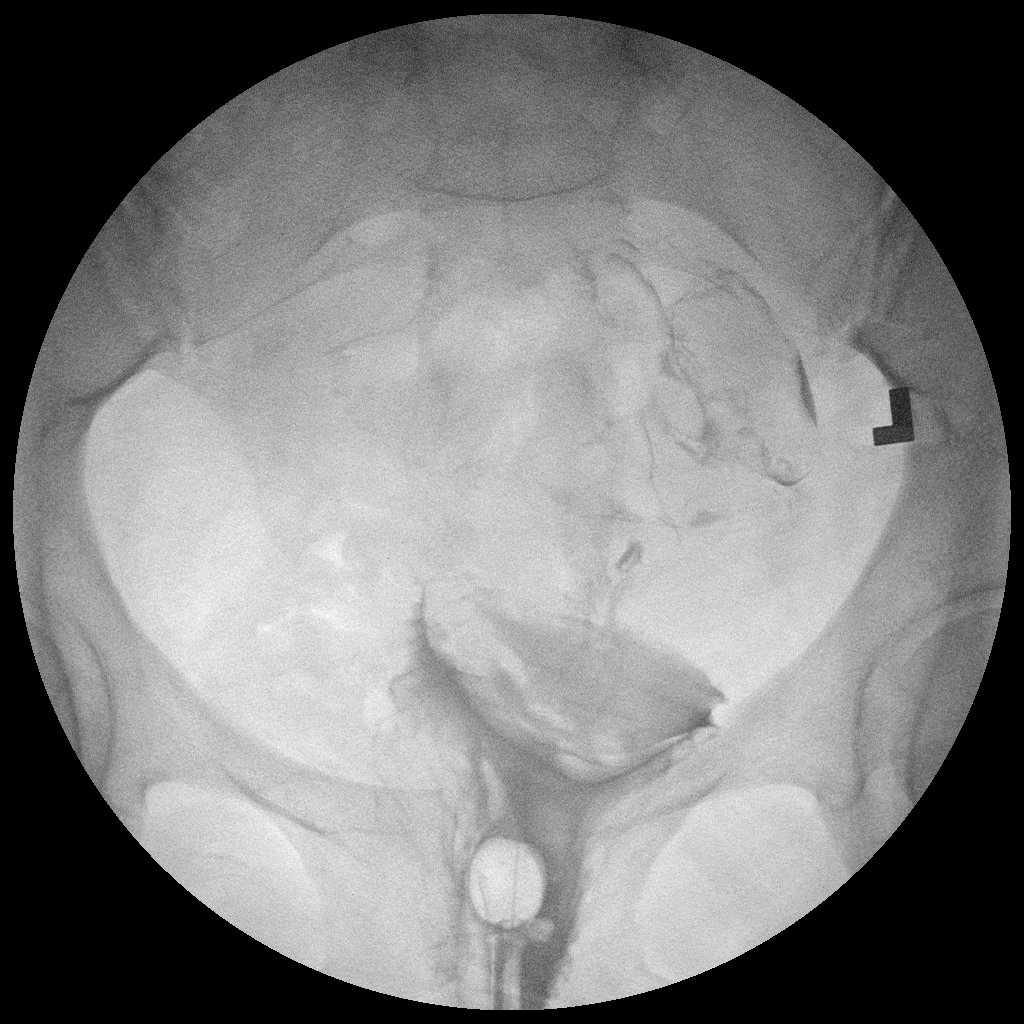

[3 of 3 positions shown; findings below may reference images not displayed]

FINDINGS: The endometrial cavity of the uterus is unremarkable appearance.

The left fallopian tube appears normal, and intraperitoneal spill of
contrast from the left fallopian tube is seen.

The right fallopian tube is partially opacified and unremarkable
appearance. No intraperitoneal spill of contrast from the right
fallopian tube is seen, however this may be due to inadequate
injection/ filling of the right fallopian tube with injected
contrast rather than distal tubal occlusion.
IMPRESSION: Patent left fallopian tube.

No intraperitoneal spill of contrast seen from the right fallopian
tube. This could be due to inadequate injection/incomplete filling
of the right fallopian tube, however distal occlusion of right
fallopian tube cannot be excluded.

## 2019-08-10 DIAGNOSIS — N939 Abnormal uterine and vaginal bleeding, unspecified: Secondary | ICD-10-CM | POA: Diagnosis not present

## 2021-10-13 ENCOUNTER — Emergency Department (HOSPITAL_BASED_OUTPATIENT_CLINIC_OR_DEPARTMENT_OTHER)
Admission: EM | Admit: 2021-10-13 | Discharge: 2021-10-13 | Disposition: A | Discharge status: Home / Self Care | Payer: BC Managed Care – PPO | Attending: Emergency Medicine | Admitting: Emergency Medicine

## 2021-10-13 ENCOUNTER — Other Ambulatory Visit: Payer: Self-pay

## 2021-10-13 ENCOUNTER — Encounter (HOSPITAL_BASED_OUTPATIENT_CLINIC_OR_DEPARTMENT_OTHER): Payer: Self-pay | Admitting: Emergency Medicine

## 2021-10-13 DIAGNOSIS — R519 Headache, unspecified: Secondary | ICD-10-CM | POA: Diagnosis present

## 2021-10-13 DIAGNOSIS — Z20822 Contact with and (suspected) exposure to covid-19: Secondary | ICD-10-CM | POA: Insufficient documentation

## 2021-10-13 DIAGNOSIS — B349 Viral infection, unspecified: Secondary | ICD-10-CM | POA: Diagnosis not present

## 2021-10-13 LAB — RESP PANEL BY RT-PCR (FLU A&B, COVID) ARPGX2
Influenza A by PCR: NEGATIVE
Influenza B by PCR: NEGATIVE
SARS Coronavirus 2 by RT PCR: NEGATIVE

## 2021-10-13 LAB — PREGNANCY, URINE: Preg Test, Ur: NEGATIVE

## 2021-10-13 MED ORDER — KETOROLAC TROMETHAMINE 15 MG/ML IJ SOLN
15.0000 mg | Freq: Once | INTRAMUSCULAR | Status: AC
  Administered 2021-10-13: 15 mg via INTRAVENOUS
  Filled 2021-10-13: qty 1

## 2021-10-13 MED ORDER — SODIUM CHLORIDE 0.9 % IV BOLUS
1000.0000 mL | Freq: Once | INTRAVENOUS | Status: AC
  Administered 2021-10-13: 1000 mL via INTRAVENOUS

## 2021-10-13 MED ORDER — METOCLOPRAMIDE HCL 5 MG/ML IJ SOLN
10.0000 mg | Freq: Once | INTRAMUSCULAR | Status: AC
  Administered 2021-10-13: 10 mg via INTRAVENOUS
  Filled 2021-10-13: qty 2

## 2021-10-13 MED ORDER — DIPHENHYDRAMINE HCL 50 MG/ML IJ SOLN
50.0000 mg | Freq: Once | INTRAMUSCULAR | Status: AC
  Administered 2021-10-13: 50 mg via INTRAVENOUS
  Filled 2021-10-13: qty 1

## 2021-10-13 NOTE — ED Notes (Signed)
Pt in bed, pt states that she is feeling better, states she is ready to go home, pt from dpt

## 2021-10-13 NOTE — ED Triage Notes (Signed)
Pt arrives pov with c/o HA with nausea x 6 days, nose bleed today. Pt also endorses laryngitis, voice is hoarse. Denies fever. Endorses theraflu and 1/2 hydrocodone with no relief

## 2021-10-13 NOTE — ED Provider Notes (Signed)
[facility] EMERGENCY DEPARTMENT Provider Note   CSN: [id number] Arrival date & time: 10/13/21  1456     History  Chief Complaint  Patient presents with   Headache    Tara DESAULNIERS is a 46 y.o. female who presents to the ED today with complaint of gradual onset, constant, full frontal headache began 1 week ago.  Patient also complains of sinus pressure, congestion, sore throat, laryngitis, nausea.  She states that she had a nosebleed earlier today from left naris that lasted approximately 10 to 15 minutes.  She has been taking over-the-counter medications without much relief.  She states she took half of a hydrocodone earlier today due to the amount of pain she was then without relief.  She denies any recent sick contacts.  She is unvaccinated for both COVID and flu. Denies any fevers, chills, ear pain, inability to swallow, drooling. Does reports she has had a very mild cough. No SOB, abdominal pain, nausea, vomiting, diarrhea.   The history is provided by the patient and medical records.      Home Medications Prior to Admission medications   Medication Sig Start Date End Date Taking? Authorizing Provider  amoxicillin (AMOXIL) 500 MG capsule Take 500 mg by mouth 3 (three) times daily.    [provider]  clotrimazole-betamethasone (LOTRISONE) cream Apply 1 application topically daily. Apply to Hand for Rash.    [provider]  Multiple Vitamin (MULTIVITAMIN WITH MINERALS) TABS tablet Take 1 tablet by mouth daily. Uses Abcuts Smart Gummy    [provider]      Allergies    Patient has no known allergies.    Review of Systems   Review of Systems  Constitutional:  Positive for fatigue. Negative for chills and fever.  HENT:  Positive for congestion, nosebleeds (resolved), sinus pressure and sore throat. Negative for trouble swallowing.   Eyes:  Negative for visual disturbance.  Respiratory:  Positive for cough (mild). Negative for shortness  of breath.   Gastrointestinal:  Negative for abdominal pain, diarrhea, nausea and vomiting.  Musculoskeletal:  Negative for myalgias.  Neurological:  Positive for headaches.  All other systems reviewed and are negative.  Physical Exam Updated Vital Signs BP 139/87 (BP Location: Right Arm)    Pulse 92    Temp 97.9 F (36.6 C) (Oral)    Resp 19    Ht 5\' 6"  (1.676 m)    Wt 95.3 kg    LMP 10/11/2021    SpO2 98%    BMI 33.89 kg/m  Physical Exam Vitals and nursing note reviewed.  Constitutional:      Appearance: She is not ill-appearing or diaphoretic.  HENT:     Head: Normocephalic and atraumatic.     Nose: Congestion present.     Right Nostril: No epistaxis.     Left Nostril: No epistaxis.     Right Sinus: No maxillary sinus tenderness or frontal sinus tenderness.     Left Sinus: No maxillary sinus tenderness or frontal sinus tenderness.     Mouth/Throat:     Mouth: Mucous membranes are moist.     Pharynx: Oropharynx is clear.     Comments: Mild posterior oropharyngeal erythema. No edema. No exudate. Uvula midline. Phonating normally and tolerating own secretions without difficulty.  Eyes:     Extraocular Movements: Extraocular movements intact.     Conjunctiva/sclera: Conjunctivae normal.     Pupils: Pupils are equal, round, and reactive to light.  Neck:  Meningeal: Brudzinski's sign and Kernig's sign absent.  Cardiovascular:     Rate and Rhythm: Normal rate and regular rhythm.     Heart sounds: Normal heart sounds.  Pulmonary:     Effort: Pulmonary effort is normal.     Breath sounds: Normal breath sounds. No wheezing, rhonchi or rales.  Abdominal:     Palpations: Abdomen is soft.     Tenderness: There is no abdominal tenderness. There is no guarding.  Musculoskeletal:     Cervical back: Neck supple.  Skin:    General: Skin is warm and dry.  Neurological:     Mental Status: She is alert and oriented to person, place, and time.     GCS: GCS eye subscore is 4. GCS verbal  subscore is 5. GCS motor subscore is 6.     Comments: Alert and oriented to self, place, time and event.   Speech is fluent, clear without dysarthria or dysphasia.   Strength 5/5 in upper/lower extremities   Sensation intact in upper/lower extremities   Normal gait.  Negative Romberg. No pronator drift.  Normal finger-to-nose and feet tapping.  CN I not tested  CN II grossly intact visual fields bilaterally. Did not visualize posterior eye.  CN III, IV, VI PERRLA and EOMs intact bilaterally  CN V Intact sensation to sharp and light touch to the face  CN VII facial movements symmetric  CN VIII not tested  CN IX, X no uvula deviation, symmetric rise of soft palate  CN XI 5/5 SCM and trapezius strength bilaterally  CN XII Midline tongue protrusion, symmetric L/R movements     ED Results / Procedures / Treatments   Labs (all labs ordered are listed, but only abnormal results are displayed) Labs Reviewed  RESP PANEL BY RT-PCR (FLU A&B, COVID) ARPGX2  PREGNANCY, URINE    EKG None  Radiology No results found.  Procedures Procedures    Medications Ordered in ED Medications  sodium chloride 0.9 % bolus 1,000 mL (1,000 mLs Intravenous New Bag/Given 10/13/21 1546)  ketorolac (TORADOL) 15 MG/ML injection 15 mg (15 mg Intravenous Given 10/13/21 1550)  metoCLOPramide (REGLAN) injection 10 mg (10 mg Intravenous Given 10/13/21 1553)  diphenhydrAMINE (BENADRYL) injection 50 mg (50 mg Intravenous Given 10/13/21 1547)    ED Course/ Medical Decision Making/ A&P                           Medical Decision Making 46 year old female who presents to the ED today with complaint of persistent headache x1 week with associated sinus pressure, congestion, sore throat, laryngitis, mild cough.  On arrival to the ED today vitals are stable.  Patient is afebrile, nontachycardic and nontachypneic and appears to be in no acute distress. Blood pressure normotensive 139/87. She does mention having  left-sided nosebleed earlier today however this is resolved.  On my exam she has no obvious focal neuro deficits.  No meningeal signs.  No posterior oropharyngeal edema or exudate.  Mild erythema. No cervical adenopathy.  Lungs clear to auscultation bilaterally. Most suspicious for viral illness at this time given constellation of symptoms. COVID and flu test have been collected and are pending - unvaccinated for both. Given age and additional symptoms lower suspicion for strep at this time. Will plan to treat with headache cocktail and follow up on COVID and flu testing.   Work-up overall reassuring.  COVID and flu are negative.  On reevaluation patient reports significant improvement in headache.  States that it was a 9 out of 10 on arrival and currently a 3 out of 10.  Again I am mostly suspicious for viral illness at this time.  Have recommended that she continue over-the-counter medications, staying hydrated, resting and following up with PCP for further evaluation.  Patient is in agreement with plan at this time and stable for discharge.   Problems Addressed: Bad headache: acute illness or injury Viral illness: acute illness or injury  Amount and/or Complexity of Data Reviewed Labs: ordered.    Details: UPT negative COVID and flu negative  Risk Prescription drug management.          Final Clinical Impression(s) / ED Diagnoses Final diagnoses:  Bad headache  Viral illness    Rx / DC Orders ED Discharge Orders     None        Discharge Instructions      Continue taking OTC medications for symptomatic relief of your symptoms. Drink plenty of fluids to stay hydrated and rest as much as possible.   Follow up with your PCP for further evaluation.   Return to the ED for any new/worsening symptoms including worsening headache, fevers, neck stiffness, rash, confusion, inability to swallow, shortness of breath, or any other new/concerning symptoms.        Eustaquio Maize, PA-C 10/13/21 1647    Malvin Johns, MD 10/13/21 [phone]

## 2021-10-13 NOTE — Discharge Instructions (Signed)
Continue taking OTC medications for symptomatic relief of your symptoms. Drink plenty of fluids to stay hydrated and rest as much as possible.   Follow up with your PCP for further evaluation.   Return to the ED for any new/worsening symptoms including worsening headache, fevers, neck stiffness, rash, confusion, inability to swallow, shortness of breath, or any other new/concerning symptoms.

## 2022-07-08 LAB — OB RESULTS CONSOLE GC/CHLAMYDIA
Chlamydia: NEGATIVE
Neisseria Gonorrhea: NEGATIVE

## 2022-07-08 LAB — OB RESULTS CONSOLE HEPATITIS B SURFACE ANTIGEN: Hepatitis B Surface Ag: NEGATIVE

## 2022-07-08 LAB — OB RESULTS CONSOLE ABO/RH: RH Type: POSITIVE

## 2022-07-08 LAB — OB RESULTS CONSOLE RPR: RPR: NONREACTIVE

## 2022-07-08 LAB — OB RESULTS CONSOLE HIV ANTIBODY (ROUTINE TESTING): HIV: NONREACTIVE

## 2022-07-08 LAB — HEPATITIS C ANTIBODY: HCV Ab: NEGATIVE

## 2022-07-08 LAB — OB RESULTS CONSOLE ANTIBODY SCREEN: Antibody Screen: NEGATIVE

## 2022-08-28 DIAGNOSIS — O09522 Supervision of elderly multigravida, second trimester: Secondary | ICD-10-CM | POA: Diagnosis not present

## 2022-08-28 DIAGNOSIS — O3429 Maternal care due to uterine scar from other previous surgery: Secondary | ICD-10-CM | POA: Diagnosis not present

## 2022-08-28 DIAGNOSIS — D563 Thalassemia minor: Secondary | ICD-10-CM | POA: Diagnosis not present

## 2022-08-28 DIAGNOSIS — O09812 Supervision of pregnancy resulting from assisted reproductive technology, second trimester: Secondary | ICD-10-CM | POA: Diagnosis not present

## 2022-09-16 DIAGNOSIS — O09522 Supervision of elderly multigravida, second trimester: Secondary | ICD-10-CM | POA: Diagnosis not present

## 2022-09-16 DIAGNOSIS — Z3689 Encounter for other specified antenatal screening: Secondary | ICD-10-CM | POA: Diagnosis not present

## 2022-10-07 DIAGNOSIS — Z3183 Encounter for assisted reproductive fertility procedure cycle: Secondary | ICD-10-CM | POA: Diagnosis not present

## 2022-10-07 DIAGNOSIS — O358XX0 Maternal care for other (suspected) fetal abnormality and damage, not applicable or unspecified: Secondary | ICD-10-CM | POA: Diagnosis not present

## 2022-10-13 DIAGNOSIS — Z362 Encounter for other antenatal screening follow-up: Secondary | ICD-10-CM | POA: Diagnosis not present

## 2022-10-13 DIAGNOSIS — O09522 Supervision of elderly multigravida, second trimester: Secondary | ICD-10-CM | POA: Diagnosis not present

## 2022-11-10 DIAGNOSIS — O09812 Supervision of pregnancy resulting from assisted reproductive technology, second trimester: Secondary | ICD-10-CM | POA: Diagnosis not present

## 2022-11-10 DIAGNOSIS — O09529 Supervision of elderly multigravida, unspecified trimester: Secondary | ICD-10-CM | POA: Diagnosis not present

## 2022-11-10 DIAGNOSIS — O3429 Maternal care due to uterine scar from other previous surgery: Secondary | ICD-10-CM | POA: Diagnosis not present

## 2022-11-10 DIAGNOSIS — O99212 Obesity complicating pregnancy, second trimester: Secondary | ICD-10-CM | POA: Diagnosis not present

## 2022-11-10 DIAGNOSIS — O09522 Supervision of elderly multigravida, second trimester: Secondary | ICD-10-CM | POA: Diagnosis not present

## 2022-11-25 DIAGNOSIS — O3429 Maternal care due to uterine scar from other previous surgery: Secondary | ICD-10-CM | POA: Diagnosis not present

## 2022-11-25 DIAGNOSIS — Z3689 Encounter for other specified antenatal screening: Secondary | ICD-10-CM | POA: Diagnosis not present

## 2022-11-25 DIAGNOSIS — O99213 Obesity complicating pregnancy, third trimester: Secondary | ICD-10-CM | POA: Diagnosis not present

## 2022-11-25 DIAGNOSIS — O09523 Supervision of elderly multigravida, third trimester: Secondary | ICD-10-CM | POA: Diagnosis not present

## 2022-12-09 DIAGNOSIS — O99213 Obesity complicating pregnancy, third trimester: Secondary | ICD-10-CM | POA: Diagnosis not present

## 2022-12-09 DIAGNOSIS — O09523 Supervision of elderly multigravida, third trimester: Secondary | ICD-10-CM | POA: Diagnosis not present

## 2022-12-23 DIAGNOSIS — O99213 Obesity complicating pregnancy, third trimester: Secondary | ICD-10-CM | POA: Diagnosis not present

## 2022-12-23 DIAGNOSIS — O09523 Supervision of elderly multigravida, third trimester: Secondary | ICD-10-CM | POA: Diagnosis not present

## 2023-01-06 ENCOUNTER — Inpatient Hospital Stay (HOSPITAL_COMMUNITY)
Admission: AD | Admit: 2023-01-06 | Discharge: 2023-01-06 | Disposition: A | Discharge status: Home / Self Care | Payer: BC Managed Care – PPO | Attending: Obstetrics and Gynecology | Admitting: Obstetrics and Gynecology

## 2023-01-06 ENCOUNTER — Encounter (HOSPITAL_COMMUNITY): Payer: Self-pay

## 2023-01-06 DIAGNOSIS — O09812 Supervision of pregnancy resulting from assisted reproductive technology, second trimester: Secondary | ICD-10-CM | POA: Diagnosis not present

## 2023-01-06 DIAGNOSIS — Z3A35 35 weeks gestation of pregnancy: Secondary | ICD-10-CM | POA: Diagnosis not present

## 2023-01-06 DIAGNOSIS — O368130 Decreased fetal movements, third trimester, not applicable or unspecified: Secondary | ICD-10-CM

## 2023-01-06 DIAGNOSIS — O99213 Obesity complicating pregnancy, third trimester: Secondary | ICD-10-CM | POA: Diagnosis not present

## 2023-01-06 DIAGNOSIS — O09523 Supervision of elderly multigravida, third trimester: Secondary | ICD-10-CM | POA: Diagnosis not present

## 2023-01-06 DIAGNOSIS — O09529 Supervision of elderly multigravida, unspecified trimester: Secondary | ICD-10-CM | POA: Diagnosis not present

## 2023-01-06 DIAGNOSIS — Z3685 Encounter for antenatal screening for Streptococcus B: Secondary | ICD-10-CM | POA: Diagnosis not present

## 2023-01-06 NOTE — Discharge Instructions (Signed)
Daily kick ct reviewed

## 2023-01-06 NOTE — MAU Provider Note (Signed)
History    CC; BPP 6/8  HPI 47 yo G2P0010 MF @ 35 4/[redacted] wk gestation sent from office due to [facility] 6/8 (-)tone. BPP done for AMA OB History     Gravida  2   Para      Term      Preterm      AB  1   Living         SAB  1   IAB      Ectopic      Multiple      Live Births              Past Medical History:  Diagnosis Date   Anemia    Medical history non-contributory     Past Surgical History:  Procedure Laterality Date   COSMETIC SURGERY     FOOT SURGERY Bilateral 1999   MYOMECTOMY N/A 09/19/2013   Procedure: MYOMECTOMY;  Surgeon: Meriel Pica, MD;  Location: [facility];  Service: Gynecology;  Laterality: N/A;    Family History  Problem Relation Age of Onset   Hyperlipidemia Mother    Hypertension Mother    Diabetes Mother    Diabetes Maternal Grandmother    Heart disease Maternal Grandmother    Hyperlipidemia Maternal Grandmother    Diabetes Maternal Grandfather    Hyperlipidemia Maternal Grandfather    Heart disease Maternal Grandfather     Social History   Tobacco Use   Smoking status: Never   Smokeless tobacco: Never  Vaping Use   Vaping Use: Never used  Substance Use Topics   Alcohol use: Yes    Comment: occasional   Drug use: No    Allergies: No Known Allergies  Medications Prior to Admission  Medication Sig Dispense Refill Last Dose   aspirin EC 81 MG tablet Take 81 mg by mouth daily. Swallow whole.   01/05/2023   Multiple Vitamin (MULTIVITAMIN WITH MINERALS) TABS tablet Take 1 tablet by mouth daily. Uses Abcuts Smart Gummy   01/05/2023   Prenatal Vit-Fe Fumarate-FA (PRENATAL MULTIVITAMIN) TABS tablet Take 1 tablet by mouth daily at 12 noon.   01/05/2023   amoxicillin (AMOXIL) 500 MG capsule Take 500 mg by mouth 3 (three) times daily.   More than a month   clotrimazole-betamethasone (LOTRISONE) cream Apply 1 application topically daily. Apply to Hand for Rash.   More than a month     Physical Exam   Blood pressure 135/78, pulse (!)  113, temperature 97.7 F (36.5 C), temperature source Oral, resp. rate 18, height 5\' 6"  (1.676 m), weight 109.3 kg, SpO2 99 %.  No exam performed today,  done in office .  Tracing: baseline 130 (+) accel to 160 Uterine irritability   IMP: BPP 6/8 AMA IVF pregnancy IUP @ 35 4/7 wk P) reassuring NST. D/c home. F/u 1 wk ED Course   MDM    Serita Kyle, MD 1:57 PM 01/06/2023

## 2023-01-06 NOTE — MAU Note (Signed)
...  Tara Tate is a 47 y.o. at [redacted]w[redacted]d here in MAU reporting: Sent over from office for failed NPP. She reports the BPP was 6/8 but reports being only taken off for movement. Patient had not eaten prior to her BPP in office. Patient ate on the way here. Denies VB or LOF. +FM.   Dr. Cherly Hensen, MD, notified of patient;s arrival.  Pain score: Denies pain. FHT: 135 initial external Lab orders placed from triage:  none

## 2023-01-07 NOTE — Patient Instructions (Signed)
Tara Tate  01/07/2023   Your procedure is scheduled on:  01/16/2023  Arrive at 0915 at Entrance C on [address] at [facility]  and [address]. You are invited to use the FREE valet parking or use the Visitor's parking deck.  Pick up the phone at the desk and dial [phone].  Call this number if you have problems the morning of surgery: [phone]  Remember:   Do not eat food:(After Midnight) Desps de medianoche.  Do not drink clear liquids: (After Midnight) Desps de medianoche.  Take these medicines the morning of surgery with A SIP OF WATER:  none   Do not wear jewelry, make-up or nail polish.  Do not wear lotions, powders, or perfumes. Do not wear deodorant.  Do not shave 48 hours prior to surgery.  Do not bring valuables to the hospital.  [facility] is not   responsible for any belongings or valuables brought to the hospital.  Contacts, dentures or bridgework may not be worn into surgery.  Leave suitcase in the car. After surgery it may be brought to your room.  For patients admitted to the hospital, checkout time is 11:00 AM the day of              discharge.      Please read over the following fact sheets that you were given:     Preparing for Surgery

## 2023-01-08 ENCOUNTER — Telehealth (HOSPITAL_COMMUNITY): Payer: Self-pay | Admitting: *Deleted

## 2023-01-08 ENCOUNTER — Encounter (HOSPITAL_COMMUNITY): Payer: Self-pay

## 2023-01-08 NOTE — Telephone Encounter (Signed)
Preadmission screen  

## 2023-01-09 ENCOUNTER — Encounter (HOSPITAL_COMMUNITY): Payer: Self-pay

## 2023-01-14 ENCOUNTER — Encounter (HOSPITAL_COMMUNITY)
Admission: RE | Admit: 2023-01-14 | Discharge: 2023-01-14 | Disposition: A | Discharge status: Home / Self Care | Payer: BC Managed Care – PPO | Source: Ambulatory Visit | Attending: Obstetrics and Gynecology | Admitting: Obstetrics and Gynecology

## 2023-01-14 ENCOUNTER — Inpatient Hospital Stay (HOSPITAL_COMMUNITY)
Admission: AD | Admit: 2023-01-14 | Discharge: 2023-01-14 | Disposition: A | Discharge status: Home / Self Care | Payer: BC Managed Care – PPO | Source: Home / Self Care | Attending: Obstetrics and Gynecology | Admitting: Obstetrics and Gynecology

## 2023-01-14 DIAGNOSIS — O3413 Maternal care for benign tumor of corpus uteri, third trimester: Secondary | ICD-10-CM | POA: Diagnosis present

## 2023-01-14 DIAGNOSIS — O09523 Supervision of elderly multigravida, third trimester: Secondary | ICD-10-CM | POA: Diagnosis not present

## 2023-01-14 DIAGNOSIS — Z01812 Encounter for preprocedural laboratory examination: Secondary | ICD-10-CM | POA: Insufficient documentation

## 2023-01-14 DIAGNOSIS — O1205 Gestational edema, complicating the puerperium: Secondary | ICD-10-CM | POA: Diagnosis not present

## 2023-01-14 DIAGNOSIS — O288 Other abnormal findings on antenatal screening of mother: Secondary | ICD-10-CM | POA: Diagnosis not present

## 2023-01-14 DIAGNOSIS — Z01818 Encounter for other preprocedural examination: Secondary | ICD-10-CM

## 2023-01-14 DIAGNOSIS — O99214 Obesity complicating childbirth: Secondary | ICD-10-CM | POA: Diagnosis not present

## 2023-01-14 DIAGNOSIS — O09812 Supervision of pregnancy resulting from assisted reproductive technology, second trimester: Secondary | ICD-10-CM | POA: Diagnosis not present

## 2023-01-14 DIAGNOSIS — D259 Leiomyoma of uterus, unspecified: Secondary | ICD-10-CM | POA: Diagnosis present

## 2023-01-14 DIAGNOSIS — D509 Iron deficiency anemia, unspecified: Secondary | ICD-10-CM | POA: Diagnosis present

## 2023-01-14 DIAGNOSIS — O3429 Maternal care due to uterine scar from other previous surgery: Secondary | ICD-10-CM | POA: Diagnosis present

## 2023-01-14 DIAGNOSIS — Z3A36 36 weeks gestation of pregnancy: Secondary | ICD-10-CM | POA: Diagnosis not present

## 2023-01-14 DIAGNOSIS — E669 Obesity, unspecified: Secondary | ICD-10-CM | POA: Diagnosis not present

## 2023-01-14 DIAGNOSIS — Z0379 Encounter for other suspected maternal and fetal conditions ruled out: Secondary | ICD-10-CM

## 2023-01-14 DIAGNOSIS — Z362 Encounter for other antenatal screening follow-up: Secondary | ICD-10-CM | POA: Insufficient documentation

## 2023-01-14 DIAGNOSIS — O9902 Anemia complicating childbirth: Secondary | ICD-10-CM | POA: Diagnosis present

## 2023-01-14 DIAGNOSIS — Z3A37 37 weeks gestation of pregnancy: Secondary | ICD-10-CM | POA: Diagnosis not present

## 2023-01-14 LAB — TYPE AND SCREEN
ABO/RH(D): O POS
Antibody Screen: NEGATIVE

## 2023-01-14 NOTE — MAU Note (Signed)
Pt sent from Dr. Cherly Hensen office for Non-reactive NST 6/8 BPP. Pt was instructed to eat and then come in. Pt had lunch. Denies any abd pain or cramping reports good fetal movement.

## 2023-01-14 NOTE — Discharge Instructions (Signed)
Daily kick ct,

## 2023-01-14 NOTE — MAU Provider Note (Signed)
History     Chief Complaint  Patient presents with   Non-stress Test   47 yo G2P0010 SF @ 36 5/[redacted] wk gestation presents for NST. Pt had BPP 6/8 in the office  OB History     Gravida  2   Para      Term      Preterm      AB  1   Living         SAB  1   IAB      Ectopic      Multiple      Live Births              Past Medical History:  Diagnosis Date   Anemia    Medical history non-contributory     Past Surgical History:  Procedure Laterality Date   COSMETIC SURGERY     FOOT SURGERY Bilateral 1999   MYOMECTOMY N/A 09/19/2013   Procedure: MYOMECTOMY;  Surgeon: Meriel Pica, MD;  Location: [facility];  Service: Gynecology;  Laterality: N/A;    Family History  Problem Relation Age of Onset   Hyperlipidemia Mother    Hypertension Mother    Diabetes Mother    Diabetes Maternal Grandmother    Heart disease Maternal Grandmother    Hyperlipidemia Maternal Grandmother    Diabetes Maternal Grandfather    Hyperlipidemia Maternal Grandfather    Heart disease Maternal Grandfather     Social History   Tobacco Use   Smoking status: Never   Smokeless tobacco: Never  Vaping Use   Vaping Use: Never used  Substance Use Topics   Alcohol use: Yes    Comment: occasional   Drug use: No    Allergies: No Known Allergies  Medications Prior to Admission  Medication Sig Dispense Refill Last Dose   aspirin EC 81 MG tablet Take 81 mg by mouth daily. Swallow whole.      clotrimazole-betamethasone (LOTRISONE) cream Apply 1 application topically daily. Apply to Hand for Rash.      Multiple Vitamin (MULTIVITAMIN WITH MINERALS) TABS tablet Take 1 tablet by mouth daily. Uses Abcuts Smart Gummy      Prenatal Vit-Fe Fumarate-FA (PRENATAL MULTIVITAMIN) TABS tablet Take 1 tablet by mouth daily at 12 noon.        Physical Exam   Blood pressure 130/79, pulse (!) 108, temperature 98.2 F (36.8 C), resp. rate 18.  No exam performed today,  done in office .  Tracing:  baseline 130 (+) 155-160 Good variability Uterine irritability ED Course  IMP: BPP 6/8 Suspected fetal condition  IUP @ 36 5/7 wk P) reassuring NST. Daily kick ct. D/c home  kee[ OB appt MDM   Serita Kyle, MD 2:53 PM 01/14/2023

## 2023-01-16 ENCOUNTER — Encounter (HOSPITAL_COMMUNITY)
Admission: AD | Disposition: A | Discharge status: Home / Self Care | Payer: Self-pay | Source: Home / Self Care | Attending: Obstetrics and Gynecology

## 2023-01-16 ENCOUNTER — Other Ambulatory Visit: Payer: Self-pay

## 2023-01-16 ENCOUNTER — Encounter (HOSPITAL_COMMUNITY): Payer: Self-pay | Admitting: Obstetrics and Gynecology

## 2023-01-16 ENCOUNTER — Inpatient Hospital Stay (HOSPITAL_COMMUNITY): Payer: BC Managed Care – PPO | Admitting: Certified Registered Nurse Anesthetist

## 2023-01-16 ENCOUNTER — Inpatient Hospital Stay (HOSPITAL_COMMUNITY)
Admission: AD | Admit: 2023-01-16 | Discharge: 2023-01-19 | DRG: 788 | Disposition: A | Discharge status: Home / Self Care | Payer: BC Managed Care – PPO | Attending: Obstetrics and Gynecology | Admitting: Obstetrics and Gynecology

## 2023-01-16 DIAGNOSIS — D509 Iron deficiency anemia, unspecified: Secondary | ICD-10-CM | POA: Diagnosis present

## 2023-01-16 DIAGNOSIS — O3413 Maternal care for benign tumor of corpus uteri, third trimester: Secondary | ICD-10-CM | POA: Diagnosis present

## 2023-01-16 DIAGNOSIS — Z01818 Encounter for other preprocedural examination: Principal | ICD-10-CM

## 2023-01-16 DIAGNOSIS — O1205 Gestational edema, complicating the puerperium: Secondary | ICD-10-CM | POA: Diagnosis not present

## 2023-01-16 DIAGNOSIS — O3429 Maternal care due to uterine scar from other previous surgery: Principal | ICD-10-CM | POA: Diagnosis present

## 2023-01-16 DIAGNOSIS — O9902 Anemia complicating childbirth: Secondary | ICD-10-CM | POA: Diagnosis present

## 2023-01-16 DIAGNOSIS — Z3A37 37 weeks gestation of pregnancy: Secondary | ICD-10-CM

## 2023-01-16 DIAGNOSIS — O09523 Supervision of elderly multigravida, third trimester: Secondary | ICD-10-CM | POA: Diagnosis not present

## 2023-01-16 DIAGNOSIS — D259 Leiomyoma of uterus, unspecified: Secondary | ICD-10-CM | POA: Diagnosis present

## 2023-01-16 LAB — CBC
HCT: 32.8 % — ABNORMAL LOW (ref 36.0–46.0)
Hemoglobin: 10.7 g/dL — ABNORMAL LOW (ref 12.0–15.0)
MCH: 28.8 pg (ref 26.0–34.0)
MCHC: 32.6 g/dL (ref 30.0–36.0)
MCV: 88.4 fL (ref 80.0–100.0)
Platelets: 215 10*3/uL (ref 150–400)
RBC: 3.71 MIL/uL — ABNORMAL LOW (ref 3.87–5.11)
RDW: 14.8 % (ref 11.5–15.5)
WBC: 8.2 10*3/uL (ref 4.0–10.5)
nRBC: 0 % (ref 0.0–0.2)

## 2023-01-16 LAB — RPR: RPR Ser Ql: NONREACTIVE

## 2023-01-16 SURGERY — Surgical Case
Anesthesia: Spinal

## 2023-01-16 MED ORDER — KETOROLAC TROMETHAMINE 30 MG/ML IJ SOLN
30.0000 mg | Freq: Once | INTRAMUSCULAR | Status: AC | PRN
  Administered 2023-01-16: 30 mg via INTRAVENOUS

## 2023-01-16 MED ORDER — SOD CITRATE-CITRIC ACID 500-334 MG/5ML PO SOLN
30.0000 mL | ORAL | Status: AC
  Administered 2023-01-16: 30 mL via ORAL

## 2023-01-16 MED ORDER — ONDANSETRON HCL 4 MG/2ML IJ SOLN
INTRAMUSCULAR | Status: DC | PRN
  Administered 2023-01-16: 4 mg via INTRAVENOUS

## 2023-01-16 MED ORDER — SODIUM CHLORIDE 0.9 % IR SOLN
Status: DC | PRN
  Administered 2023-01-16: 1

## 2023-01-16 MED ORDER — ACETAMINOPHEN 500 MG PO TABS
1000.0000 mg | ORAL_TABLET | Freq: Four times a day (QID) | ORAL | Status: DC
  Administered 2023-01-16 – 2023-01-19 (×11): 1000 mg via ORAL
  Filled 2023-01-16 (×13): qty 2

## 2023-01-16 MED ORDER — TETANUS-DIPHTH-ACELL PERTUSSIS 5-2.5-18.5 LF-MCG/0.5 IM SUSY: 0.5000 mL | PREFILLED_SYRINGE | Freq: Once | INTRAMUSCULAR | Status: DC

## 2023-01-16 MED ORDER — OXYTOCIN-SODIUM CHLORIDE 30-0.9 UT/500ML-% IV SOLN
INTRAVENOUS | Status: DC | PRN
  Administered 2023-01-16 (×2): 30 [IU] via INTRAVENOUS

## 2023-01-16 MED ORDER — KETOROLAC TROMETHAMINE 30 MG/ML IJ SOLN
INTRAMUSCULAR | Status: AC
  Filled 2023-01-16: qty 1

## 2023-01-16 MED ORDER — PHENYLEPHRINE HCL-NACL 20-0.9 MG/250ML-% IV SOLN
INTRAVENOUS | Status: AC
  Filled 2023-01-16: qty 250

## 2023-01-16 MED ORDER — DEXAMETHASONE SODIUM PHOSPHATE 10 MG/ML IJ SOLN
INTRAMUSCULAR | Status: DC | PRN
  Administered 2023-01-16: 10 mg via INTRAVENOUS

## 2023-01-16 MED ORDER — MORPHINE SULFATE (PF) 0.5 MG/ML IJ SOLN
INTRAMUSCULAR | Status: DC | PRN
  Administered 2023-01-16: 150 ug via INTRATHECAL

## 2023-01-16 MED ORDER — STERILE WATER FOR IRRIGATION IR SOLN
Status: DC | PRN
  Administered 2023-01-16: 1000 mL

## 2023-01-16 MED ORDER — SIMETHICONE 80 MG PO CHEW
80.0000 mg | CHEWABLE_TABLET | Freq: Three times a day (TID) | ORAL | Status: DC
  Administered 2023-01-16 – 2023-01-19 (×6): 80 mg via ORAL
  Filled 2023-01-16 (×8): qty 1

## 2023-01-16 MED ORDER — BUPIVACAINE HCL (PF) 0.25 % IJ SOLN
INTRAMUSCULAR | Status: DC | PRN
  Administered 2023-01-16: 30 mL

## 2023-01-16 MED ORDER — SENNOSIDES-DOCUSATE SODIUM 8.6-50 MG PO TABS
2.0000 | ORAL_TABLET | ORAL | Status: DC
  Administered 2023-01-17: 2 via ORAL
  Filled 2023-01-16: qty 2

## 2023-01-16 MED ORDER — ZOLPIDEM TARTRATE 5 MG PO TABS: 5.0000 mg | ORAL_TABLET | Freq: Every evening | ORAL | Status: DC | PRN

## 2023-01-16 MED ORDER — MORPHINE SULFATE (PF) 0.5 MG/ML IJ SOLN
INTRAMUSCULAR | Status: AC
  Filled 2023-01-16: qty 10

## 2023-01-16 MED ORDER — SIMETHICONE 80 MG PO CHEW
80.0000 mg | CHEWABLE_TABLET | ORAL | Status: DC | PRN
  Administered 2023-01-17: 80 mg via ORAL

## 2023-01-16 MED ORDER — IBUPROFEN 600 MG PO TABS
600.0000 mg | ORAL_TABLET | Freq: Four times a day (QID) | ORAL | Status: DC
  Administered 2023-01-16 – 2023-01-19 (×11): 600 mg via ORAL
  Filled 2023-01-16 (×12): qty 1

## 2023-01-16 MED ORDER — ONDANSETRON HCL 4 MG/2ML IJ SOLN
INTRAMUSCULAR | Status: AC
  Filled 2023-01-16: qty 2

## 2023-01-16 MED ORDER — OXYCODONE HCL 5 MG PO TABS
5.0000 mg | ORAL_TABLET | ORAL | Status: DC | PRN
  Administered 2023-01-17 (×2): 5 mg via ORAL
  Filled 2023-01-16 (×2): qty 1

## 2023-01-16 MED ORDER — BUPIVACAINE IN DEXTROSE 0.75-8.25 % IT SOLN
INTRATHECAL | Status: DC | PRN
  Administered 2023-01-16: 1.6 mL via INTRATHECAL

## 2023-01-16 MED ORDER — PHENYLEPHRINE HCL-NACL 20-0.9 MG/250ML-% IV SOLN
INTRAVENOUS | Status: DC | PRN
  Administered 2023-01-16: 60 ug/min via INTRAVENOUS

## 2023-01-16 MED ORDER — WITCH HAZEL-GLYCERIN EX PADS: 1.0000 | MEDICATED_PAD | CUTANEOUS | Status: DC | PRN

## 2023-01-16 MED ORDER — CEFAZOLIN SODIUM-DEXTROSE 2-4 GM/100ML-% IV SOLN
2.0000 g | INTRAVENOUS | Status: AC
  Administered 2023-01-16: 2 g via INTRAVENOUS

## 2023-01-16 MED ORDER — DEXAMETHASONE SODIUM PHOSPHATE 10 MG/ML IJ SOLN
INTRAMUSCULAR | Status: AC
  Filled 2023-01-16: qty 1

## 2023-01-16 MED ORDER — BUPIVACAINE HCL (PF) 0.25 % IJ SOLN
INTRAMUSCULAR | Status: AC
  Filled 2023-01-16: qty 30

## 2023-01-16 MED ORDER — LACTATED RINGERS IV SOLN: INTRAVENOUS | Status: DC

## 2023-01-16 MED ORDER — CEFAZOLIN SODIUM-DEXTROSE 2-4 GM/100ML-% IV SOLN
INTRAVENOUS | Status: AC
  Filled 2023-01-16: qty 100

## 2023-01-16 MED ORDER — DIBUCAINE (PERIANAL) 1 % EX OINT: 1.0000 | TOPICAL_OINTMENT | CUTANEOUS | Status: DC | PRN

## 2023-01-16 MED ORDER — MENTHOL 3 MG MT LOZG: 1.0000 | LOZENGE | OROMUCOSAL | Status: DC | PRN

## 2023-01-16 MED ORDER — HYDROMORPHONE HCL 1 MG/ML IJ SOLN: 0.2500 mg | INTRAMUSCULAR | Status: DC | PRN

## 2023-01-16 MED ORDER — MEPERIDINE HCL 25 MG/ML IJ SOLN: 6.2500 mg | INTRAMUSCULAR | Status: DC | PRN

## 2023-01-16 MED ORDER — DIPHENHYDRAMINE HCL 25 MG PO CAPS: 25.0000 mg | ORAL_CAPSULE | Freq: Four times a day (QID) | ORAL | Status: DC | PRN

## 2023-01-16 MED ORDER — OXYTOCIN-SODIUM CHLORIDE 30-0.9 UT/500ML-% IV SOLN: 2.5000 [IU]/h | INTRAVENOUS | Status: AC

## 2023-01-16 MED ORDER — FENTANYL CITRATE (PF) 100 MCG/2ML IJ SOLN
INTRAMUSCULAR | Status: DC | PRN
  Administered 2023-01-16: 15 ug via INTRATHECAL

## 2023-01-16 MED ORDER — PRENATAL MULTIVITAMIN CH
1.0000 | ORAL_TABLET | Freq: Every day | ORAL | Status: DC
  Administered 2023-01-17 – 2023-01-19 (×3): 1 via ORAL
  Filled 2023-01-16 (×3): qty 1

## 2023-01-16 MED ORDER — PROMETHAZINE HCL 25 MG/ML IJ SOLN
6.2500 mg | INTRAMUSCULAR | Status: DC | PRN
  Administered 2023-01-16: 6.25 mg via INTRAVENOUS

## 2023-01-16 MED ORDER — FENTANYL CITRATE (PF) 100 MCG/2ML IJ SOLN
INTRAMUSCULAR | Status: AC
  Filled 2023-01-16: qty 2

## 2023-01-16 MED ORDER — OXYTOCIN-SODIUM CHLORIDE 30-0.9 UT/500ML-% IV SOLN
INTRAVENOUS | Status: AC
  Filled 2023-01-16: qty 500

## 2023-01-16 MED ORDER — PROMETHAZINE HCL 25 MG/ML IJ SOLN
INTRAMUSCULAR | Status: AC
  Filled 2023-01-16: qty 1

## 2023-01-16 MED ORDER — PHENYLEPHRINE 80 MCG/ML (10ML) SYRINGE FOR IV PUSH (FOR BLOOD PRESSURE SUPPORT)
PREFILLED_SYRINGE | INTRAVENOUS | Status: DC | PRN
  Administered 2023-01-16 (×3): 80 ug via INTRAVENOUS

## 2023-01-16 MED ORDER — COCONUT OIL OIL: 1.0000 | TOPICAL_OIL | Status: DC | PRN

## 2023-01-16 MED ORDER — OXYCODONE HCL 5 MG/5ML PO SOLN: 5.0000 mg | Freq: Once | ORAL | Status: DC | PRN

## 2023-01-16 MED ORDER — OXYCODONE HCL 5 MG PO TABS: 5.0000 mg | ORAL_TABLET | Freq: Once | ORAL | Status: DC | PRN

## 2023-01-16 MED ORDER — POVIDONE-IODINE 10 % EX SWAB
2.0000 | Freq: Once | CUTANEOUS | Status: AC
  Administered 2023-01-16: 2 via TOPICAL

## 2023-01-16 MED ORDER — SOD CITRATE-CITRIC ACID 500-334 MG/5ML PO SOLN
ORAL | Status: AC
  Filled 2023-01-16: qty 30

## 2023-01-16 SURGICAL SUPPLY — 46 items
APL PRP STRL LF DISP 70% ISPRP (MISCELLANEOUS) ×1
APL SKNCLS STERI-STRIP NONHPOA (GAUZE/BANDAGES/DRESSINGS)
BARRIER ADHS 3X4 INTERCEED (GAUZE/BANDAGES/DRESSINGS) ×1 IMPLANT
BENZOIN TINCTURE PRP APPL 2/3 (GAUZE/BANDAGES/DRESSINGS) IMPLANT
BRR ADH 4X3 ABS CNTRL BYND (GAUZE/BANDAGES/DRESSINGS) ×2
CHLORAPREP W/TINT 26 (MISCELLANEOUS) ×1 IMPLANT
CLAMP UMBILICAL CORD (MISCELLANEOUS) IMPLANT
CLOTH BEACON ORANGE TIMEOUT ST (SAFETY) ×1 IMPLANT
DRAPE C SECTION CLR SCREEN (DRAPES) ×1 IMPLANT
DRSG OPSITE POSTOP 4X10 (GAUZE/BANDAGES/DRESSINGS) ×1 IMPLANT
ELECT REM PT RETURN 9FT ADLT (ELECTROSURGICAL) ×1
ELECTRODE REM PT RTRN 9FT ADLT (ELECTROSURGICAL) ×1 IMPLANT
EXTRACTOR VACUUM M CUP 4 TUBE (SUCTIONS) IMPLANT
GAUZE SPONGE 4X4 12PLY STRL LF (GAUZE/BANDAGES/DRESSINGS) IMPLANT
GLOVE BIOGEL PI IND STRL 7.0 (GLOVE) ×2 IMPLANT
GLOVE ECLIPSE 6.5 STRL STRAW (GLOVE) ×1 IMPLANT
GOWN STRL REUS W/TWL LRG LVL3 (GOWN DISPOSABLE) ×2 IMPLANT
KIT ABG SYR 3ML LUER SLIP (SYRINGE) IMPLANT
MAT PREVALON FULL STRYKER (MISCELLANEOUS) IMPLANT
NDL HYPO 25X5/8 SAFETYGLIDE (NEEDLE) IMPLANT
NEEDLE HYPO 22GX1.5 SAFETY (NEEDLE) ×1 IMPLANT
NEEDLE HYPO 25X5/8 SAFETYGLIDE (NEEDLE) IMPLANT
NS IRRIG 1000ML POUR BTL (IV SOLUTION) ×1 IMPLANT
PACK C SECTION WH (CUSTOM PROCEDURE TRAY) ×1 IMPLANT
PAD OB MATERNITY 4.3X12.25 (PERSONAL CARE ITEMS) ×1 IMPLANT
RTRCTR C-SECT PINK 25CM LRG (MISCELLANEOUS) IMPLANT
STRIP CLOSURE SKIN 1/2X4 (GAUZE/BANDAGES/DRESSINGS) IMPLANT
SUT CHROMIC GUT AB #0 18 (SUTURE) IMPLANT
SUT MNCRL 0 VIOLET CTX 36 (SUTURE) ×3 IMPLANT
SUT MON AB 2-0 SH 27 (SUTURE)
SUT MON AB 2-0 SH27 (SUTURE) IMPLANT
SUT MON AB 3-0 SH 27 (SUTURE) ×1
SUT MON AB 3-0 SH27 (SUTURE) IMPLANT
SUT MON AB 4-0 PS1 27 (SUTURE) IMPLANT
SUT PLAIN 2 0 (SUTURE)
SUT PLAIN 2 0 XLH (SUTURE) IMPLANT
SUT PLAIN ABS 2-0 CT1 27XMFL (SUTURE) IMPLANT
SUT PROLENE 0 CT 1 30 (SUTURE) IMPLANT
SUT VIC AB 0 CT1 36 (SUTURE) ×2 IMPLANT
SUT VIC AB 2-0 CT1 27 (SUTURE) ×1
SUT VIC AB 2-0 CT1 TAPERPNT 27 (SUTURE) ×1 IMPLANT
SUT VIC AB 4-0 PS2 27 (SUTURE) IMPLANT
SYR CONTROL 10ML LL (SYRINGE) ×1 IMPLANT
TOWEL OR 17X24 6PK STRL BLUE (TOWEL DISPOSABLE) ×1 IMPLANT
TRAY FOLEY W/BAG SLVR 14FR LF (SET/KITS/TRAYS/PACK) IMPLANT
WATER STERILE IRR 1000ML POUR (IV SOLUTION) ×1 IMPLANT

## 2023-01-16 NOTE — Anesthesia Postprocedure Evaluation (Signed)
Anesthesia Post Note  Patient: Tara Tate  Procedure(s) Performed: CESAREAN SECTION APPLICATION OF CELL SAVER     Patient location during evaluation: PACU Anesthesia Type: Spinal Level of consciousness: awake and alert Pain management: pain level controlled Vital Signs Assessment: post-procedure vital signs reviewed and stable Respiratory status: spontaneous breathing, nonlabored ventilation and respiratory function stable Cardiovascular status: blood pressure returned to baseline and stable Postop Assessment: no apparent nausea or vomiting Anesthetic complications: no   No notable events documented.  Last Vitals:  Vitals:   01/16/23 1245 01/16/23 1320  BP: 105/71 111/70  Pulse: 69 62  Resp: (!) 8 16  Temp:  (!) 36.4 C  SpO2: 96% 98%    Last Pain:  Vitals:   01/16/23 1320  TempSrc: Oral  PainSc: 0-No pain   Pain Goal: Patients Stated Pain Goal: 4 (01/16/23 1210)              Epidural/Spinal Function Cutaneous sensation: Normal sensation (01/16/23 1320), Patient able to flex knees: Yes (01/16/23 1320), Patient able to lift hips off bed: Yes (01/16/23 1320), Back pain beyond tenderness at insertion site: No (01/16/23 1320), Progressively worsening motor and/or sensory loss: No (01/16/23 1320), Bowel and/or bladder incontinence post epidural: No (01/16/23 1320)  Lowella Curb

## 2023-01-16 NOTE — Anesthesia Procedure Notes (Signed)
Spinal  Patient location during procedure: OB Start time: 01/16/2023 10:25 AM End time: 01/16/2023 10:30 AM Reason for block: surgical anesthesia Staffing Performed: anesthesiologist  Anesthesiologist: Lowella Curb, MD Performed by: Lowella Curb, MD Authorized by: Lowella Curb, MD   Preanesthetic Checklist Completed: patient identified, IV checked, risks and benefits discussed, surgical consent, monitors and equipment checked, pre-op evaluation and timeout performed Spinal Block Patient position: sitting Prep: DuraPrep and site prepped and draped Patient monitoring: heart rate, cardiac monitor, continuous pulse ox and blood pressure Approach: midline Location: L3-4 Injection technique: single-shot Needle Needle type: Pencan  Needle gauge: 24 G Needle length: 10 cm Assessment Sensory level: T4 Events: CSF return

## 2023-01-16 NOTE — Plan of Care (Signed)
  Problem: Clinical Measurements: Goal: Ability to maintain clinical measurements within normal limits will improve Outcome: Progressing Goal: Diagnostic test results will improve Outcome: Progressing Goal: Respiratory complications will improve Outcome: Progressing Goal: Cardiovascular complication will be avoided Outcome: Progressing   Problem: Activity: Goal: Risk for activity intolerance will decrease Outcome: Progressing   Problem: Coping: Goal: Level of anxiety will decrease Outcome: Progressing   Problem: Elimination: Goal: Will not experience complications related to bowel motility Outcome: Progressing Goal: Will not experience complications related to urinary retention Outcome: Progressing   Problem: Pain Managment: Goal: General experience of comfort will improve Outcome: Progressing   Problem: Safety: Goal: Ability to remain free from injury will improve Outcome: Progressing   Problem: Skin Integrity: Goal: Risk for impaired skin integrity will decrease Outcome: Progressing   Problem: Education: Goal: Knowledge of the prescribed therapeutic regimen will improve Outcome: Progressing Goal: Understanding of sexual limitations or changes related to disease process or condition will improve Outcome: Progressing Goal: Individualized Educational Video(s) Outcome: Progressing   Problem: Self-Concept: Goal: Communication of feelings regarding changes in body function or appearance will improve Outcome: Progressing   Problem: Skin Integrity: Goal: Demonstration of wound healing without infection will improve Outcome: Progressing   Problem: Activity: Goal: Will verbalize the importance of balancing activity with adequate rest periods Outcome: Progressing Goal: Ability to tolerate increased activity will improve Outcome: Progressing   Problem: Coping: Goal: Ability to identify and utilize available resources and services will improve Outcome: Progressing    Problem: Life Cycle: Goal: Chance of risk for complications during the postpartum period will decrease Outcome: Progressing   Problem: Role Relationship: Goal: Ability to demonstrate positive interaction with newborn will improve Outcome: Progressing   Problem: Skin Integrity: Goal: Demonstration of wound healing without infection will improve Outcome: Progressing

## 2023-01-16 NOTE — H&P (Signed)
Tara Tate is a 47 y.o. female presenting for Primary C/S @ 37 wk due to prior hx myomectomy. OB History     Gravida  2   Para      Term      Preterm      AB  1   Living         SAB  1   IAB      Ectopic      Multiple      Live Births             Past Medical History:  Diagnosis Date   Anemia    Medical history non-contributory    Past Surgical History:  Procedure Laterality Date   COSMETIC SURGERY     FOOT SURGERY Bilateral 1999   MYOMECTOMY N/A 09/19/2013   Procedure: MYOMECTOMY;  Surgeon: Meriel Pica, MD;  Location: [facility];  Service: Gynecology;  Laterality: N/A;   Family History: family history includes Diabetes in her maternal grandfather, maternal grandmother, and mother; Heart disease in her maternal grandfather and maternal grandmother; Hyperlipidemia in her maternal grandfather, maternal grandmother, and mother; Hypertension in her mother. Social History:  reports that she has never smoked. She has never used smokeless tobacco. She reports current alcohol use. She reports that she does not use drugs.     Maternal Diabetes: No Genetic Screening: Normal Maternal Ultrasounds/Referrals: Normal Fetal Ultrasounds or other Referrals:  Fetal echo nl( IVF preg) Maternal Substance Abuse:  No Significant Maternal Medications:  None Significant Maternal Lab Results:  Group B Strep negative and Other:  false positive RPR Number of Prenatal Visits:greater than 3 verified prenatal visits Other Comments:   AMA, IVF pregnancy  Review of Systems  All other systems reviewed and are negative.  History   There were no vitals taken for this visit. Exam Physical Exam Constitutional:      Appearance: Normal appearance.  HENT:     Left Ear: Tympanic membrane normal.  Eyes:     Extraocular Movements: Extraocular movements intact.  Cardiovascular:     Rate and Rhythm: Regular rhythm.     Heart sounds: Normal heart sounds.  Pulmonary:     Breath  sounds: Normal breath sounds.  Abdominal:     Comments: Transverse skin incision. gravid  Musculoskeletal:        General: Swelling present. Normal range of motion.     Cervical back: Normal range of motion and neck supple.  Neurological:     General: No focal deficit present.     Mental Status: She is alert and oriented to person, place, and time.  Psychiatric:        Mood and Affect: Mood normal.        Behavior: Behavior normal.     Prenatal labs: ABO, Rh: --/--/O POS (05/29 1478) Antibody: NEG (05/29 0933) Rubella:  Immune RPR:   reactive  fTA 1;1( 4/10) HBsAg:   neg HIV:   neg GBS:   negative  Assessment/Plan: Previous Uterine scar affecting pregnancy  IVF pregnancy  AMA P) Primary C/S. Procedure explained. Risk of surgery reviewed including infection, bleeding, possible need for blood transfusion and its risk, injury to bladder,bowel, ureter, internal scar tissue. All ? answered Tara Tate 01/16/2023, 5:37 AM

## 2023-01-16 NOTE — Op Note (Unsigned)
Tara Tate, Tate MEDICAL RECORD NO: [id number] ACCOUNT NO: [id number] DATE OF BIRTH: October 11, 1975 FACILITY: [facility] LOCATION: [facility] PHYSICIAN: Shariff Lasky A. Cherly Hensen, MD  Operative Report   DATE OF PROCEDURE: 01/16/2023  PREOPERATIVE DIAGNOSES:  Previous myomectomy, intrauterine gestation at 34 weeks, advanced maternal age, Pregnancy conceived by in-vitro fertilization, uterine fibroids.  PROCEDURE:  Primary cesarean section, Kerr hysterotomy.  POSTOPERATIVE DIAGNOSES:  Previous myomectomy,intrauterine gestation at 72 weeks, advanced maternal age, Pregnancy conceived by in-vitro fertilization, uterine fibroids  ANESTHESIA:  Spinal.  SURGEON:  Asmi Fugere A. Cherly Hensen, MD  ASSISTANT:  Janene Madeira, MD  The assistant was necessary in order to facilitate the surgery, which was complicated by the patient having had previous abdominal surgery and to help with exposure .  DESCRIPTION OF PROCEDURE:  Under adequate spinal anesthesia, the patient was placed in the supine position with a left lateral tilt.  She was sterilely prepped and draped in usual fashion.  An indwelling Foley catheter was sterilely placed.  The patient  had a previous abdominoplasty. 0.25% Marcaine was injected along that tummy tuck scar. Incision was then subsequently made, carried down to the rectus fascia. Cell Saver was being used. Rectus fascia opened transversely, at which time sutures from the tummy tuck was noted and was  unavoidably cut.  Rectus fascia was then bluntly and sharply dissected off the rectus muscle in superior and inferior fashion.  The rectus muscle was split in the midline.  The parietal peritoneum was entered sharply and extended superiorly and  inferiorly.  A self-retaining Alexis retractor was then placed.  Fibroid uterus was noted.  Vesicouterine peritoneum was opened transversely and the bladder was gently displaced bluntly.  A curvilinear low transverse incision was then made and extended in a   cephalic and caudad fashion using blunt dissection.  Artificial rupture of membranes occurred.  Copious amniotic fluid was noted.  Subsequent delivery of a live female with a nuchal cord x1 which was reduced was  accomplished.  The delayed cord clamping occurred x1 minute.  The cord was clamped, cut.  The baby was transferred to the waiting pediatrician who assigned Apgars of 7 and 8 at 1 and 5 minutes respectively.  The uterus was massaged until the placenta was  removed.  Uterine cavity was cleaned of debris.  Uterine incision had no extension.  It was closed in 2 layers of 0 Monocryl running locked stitch imbricating with 0 Monocryl locked stitch.  Bleeding along the serosal surface resulted in a 3-0 Monocryl  running locked stitch closure of the serosa.  There was a bleeding vessel in the midline of the incision which 3 figure-of-eight sutures was used in order to obtain hemostasis prior to closing the second layer with Monocryl.  Normal tubes and ovaries  were noted bilaterally.  There was a peritoneal type adhesion from the anterior fundus of the uterus, which was ultimately lysed to facilitate the procedure.  The abdomen was irrigated, suctioned of debris.  Interceed was placed overlying the incision in  inverted T fashion.  The Alexis retractor was removed.  The parietal peritoneum was closed with 2-0 Vicryl.  The rectus fascia was reapproximated first with 0 Prolene suture where the previous tummy tuck suture was present and then the fascia was then  closed with 0 Vicryl x2.  The subcutaneous area was deep.  It was irrigated, suctioned of debris and a running stitch of 2-0 plain suture was placed and the skin approximated using 4-0 Vicryl subcuticular closure.  Steri-Strips and benzoin was  placed.  SPECIMEN:  Placenta, not sent to pathology.  ESTIMATED BLOOD LOSS:  552 mL  INTRAOPERATIVE FLUIDS:  2500 mL. Patient also received 250 mL of Cell Saver blood.  URINE OUTPUT: 100  mL  COMPLICATIONS:  None.  DISPOSITION:  The patient tolerated the procedure well and was transferred to recovery in stable condition.  The baby went to the NICU for respiratory.   VAI D: 01/16/2023 11:42:36 pm T: 01/16/2023 11:57:00 pm  JOB: [id number]/ [id number]

## 2023-01-16 NOTE — Lactation Note (Signed)
This note was copied from a baby's chart.  NICU Lactation Consultation Note  Patient Name: Tara Tate [id number] Date: 01/16/2023 Age:45 hours  Reason for consult: Initial assessment; NICU baby; Early term 37-38.6wks; 1st time breastfeeding; Primapara; Other (Comment) (AMA, IVF)  SUBJECTIVE  LC in to visit with P1 Mom of ET infant in the NICU receiving respiratory support.  Reviewed breast massage and hand expression, glistening of colostrum noted on nipple. Mom interested in initiating pumping.  LC set up DEBP and assisted Mom with her first pumping using 21 mm flanges.  Reviewed importance of consistent pumping to support a full milk supply.  Encouraged STS with baby when able to.  OBJECTIVE Infant data: Mother's Current Feeding Choice: Breast Milk  Infant feeding assessment NPO Maternal data: G2P0010  C-Section, Low Transverse Has patient been taught Hand Expression?: Yes Hand Expression Comments: glistening on nipple Significant Breast History:: little breast changes Current breast feeding challenges:: Infant separation Does the patient have breastfeeding experience prior to this delivery?: No Pumping frequency: Initiated pumping at 4 hrs post delivery Flange Size: 21 Risk factor for low milk supply:: Infant separation  Pump: Personal, Hands Free (MomCozy from insurance)  ASSESSMENT Infant: Feeding Status: NPO  Maternal: AMA, IVF pregnancy INTERVENTIONS/PLAN Interventions: Tools: Pump; Flanges Pump Education: Setup, frequency, and cleaning; Milk Storage  Plan: 1- STS with baby as much as possible 2-Breast massage and hand express before and/or after double pumping. 3-Pump both breasts every 3 hrs when awake   Consult Status: NICU follow-up NICU Follow-up type: New admission follow up   Tara Tate 01/16/2023, 4:02 PM

## 2023-01-16 NOTE — Brief Op Note (Signed)
01/16/2023  12:03 PM  PATIENT:  Tara Tate  47 y.o. female  PRE-OPERATIVE DIAGNOSIS:  Primary -history of myomectomy  POST-OPERATIVE DIAGNOSIS:  Primary -history of myomectomy  PROCEDURE:  Procedure(s): CESAREAN SECTION (N/A) APPLICATION OF CELL SAVER (N/A)  SURGEON:  Surgeon(s) and Role:    * Maxie Better, MD - Primary    * Cresenzo, Cyndi Lennert, MD - Fellow  PHYSICIAN ASSISTANT:   ASSISTANTS: {ASSISTANTS:31801}   ANESTHESIA:   {procedures; anesthesia:812}  EBL:  {None/Minimal: 21241}   BLOOD ADMINISTERED:{BLOOD GIVEN TYPES AND AMOUNTS:20467}  DRAINS: {Devices; drains:31758}   LOCAL MEDICATIONS USED:  {LOCAL MEDICATIONS:10721995}  SPECIMEN:  {ONC STAGING; AJCC TYPE OF SPECIMEN:115600001}  DISPOSITION OF SPECIMEN:  {SPECIMEN DISPOSITION:204680}  COUNTS:  {OR COUNTS CORRECT/INCORRECT:204690}  TOURNIQUET:  * No tourniquets in log *  DICTATION: .{DICTATION [phone]}  PLAN OF CARE: {OPTIME PLAN OF WGNF:6213086578}  PATIENT DISPOSITION:  {op note disposition:31782}   Delay start of Pharmacological VTE agent (>24hrs) due to surgical blood loss or risk of bleeding: {YES/NO/NOT APPLICABLE:20182}

## 2023-01-16 NOTE — Interval H&P Note (Signed)
History and Physical Interval Note:  01/16/2023 10:31 AM  Tara Tate  has presented today for surgery, with the diagnosis of Primary -history of myomectomy.  The various methods of treatment have been discussed with the patient and family. After consideration of risks, benefits and other options for treatment, the patient has consented to  Procedure(s): CESAREAN SECTION (N/A) APPLICATION OF CELL SAVER (N/A) as a surgical intervention.  The patient's history has been reviewed, patient examined, no change in status, stable for surgery.  I have reviewed the patient's chart and labs.  Questions were answered to the patient's satisfaction.     Kayelynn Abdou A Andrue Dini

## 2023-01-16 NOTE — Anesthesia Preprocedure Evaluation (Signed)
Anesthesia Evaluation  Patient identified by MRN, date of birth, ID band Patient awake    Reviewed: Allergy & Precautions, H&P , NPO status , Patient's Chart, lab work & pertinent test results  Airway Mallampati: II  TM Distance: >3 FB Neck ROM: Full    Dental no notable dental hx. (+) Teeth Intact, Dental Advisory Given   Pulmonary neg pulmonary ROS   Pulmonary exam normal breath sounds clear to auscultation       Cardiovascular negative cardio ROS Normal cardiovascular exam Rhythm:Regular Rate:Normal     Neuro/Psych negative neurological ROS  negative psych ROS   GI/Hepatic negative GI ROS, Neg liver ROS,,,  Endo/Other  negative endocrine ROS    Renal/GU negative Renal ROS     Musculoskeletal negative musculoskeletal ROS (+)    Abdominal  (+) + obese  Peds  Hematology negative hematology ROS (+)   Anesthesia Other Findings   Reproductive/Obstetrics (+) Pregnancy                             Anesthesia Physical Anesthesia Plan  ASA: 3  Anesthesia Plan: Spinal   Post-op Pain Management:    Induction: Intravenous  PONV Risk Score and Plan: 2 and Treatment may vary due to age or medical condition  Airway Management Planned: Natural Airway  Additional Equipment:   Intra-op Plan:   Post-operative Plan:   Informed Consent: I have reviewed the patients History and Physical, chart, labs and discussed the procedure including the risks, benefits and alternatives for the proposed anesthesia with the patient or authorized representative who has indicated his/her understanding and acceptance.     Dental advisory given  Plan Discussed with: CRNA  Anesthesia Plan Comments:         Anesthesia Quick Evaluation

## 2023-01-16 NOTE — Transfer of Care (Signed)
Immediate Anesthesia Transfer of Care Note  Patient: Tara Tate  Procedure(s) Performed: CESAREAN SECTION APPLICATION OF CELL SAVER  Patient Location: PACU  Anesthesia Type:Spinal  Level of Consciousness: awake, alert , and oriented  Airway & Oxygen Therapy: Patient Spontanous Breathing  Post-op Assessment: Report given to RN and Post -op Vital signs reviewed and stable  Post vital signs: Reviewed and stable  Last Vitals:  Vitals Value Taken Time  BP    Temp    Pulse    Resp    SpO2      Last Pain:  Vitals:   01/16/23 0932  TempSrc: Oral         Complications: No notable events documented.

## 2023-01-17 ENCOUNTER — Encounter (HOSPITAL_COMMUNITY): Payer: Self-pay | Admitting: Obstetrics and Gynecology

## 2023-01-17 LAB — CBC
HCT: 26 % — ABNORMAL LOW (ref 36.0–46.0)
Hemoglobin: 8.7 g/dL — ABNORMAL LOW (ref 12.0–15.0)
MCH: 28.8 pg (ref 26.0–34.0)
MCHC: 33.5 g/dL (ref 30.0–36.0)
MCV: 86.1 fL (ref 80.0–100.0)
Platelets: 202 10*3/uL (ref 150–400)
RBC: 3.02 MIL/uL — ABNORMAL LOW (ref 3.87–5.11)
RDW: 14.6 % (ref 11.5–15.5)
WBC: 14.5 10*3/uL — ABNORMAL HIGH (ref 4.0–10.5)
nRBC: 0 % (ref 0.0–0.2)

## 2023-01-17 MED ORDER — IRON SUCROSE 500 MG IVPB - SIMPLE MED
500.0000 mg | Freq: Once | INTRAVENOUS | Status: DC
  Filled 2023-01-17: qty 275

## 2023-01-17 MED ORDER — SODIUM CHLORIDE 0.9 % IV SOLN
500.0000 mg | Freq: Once | INTRAVENOUS | Status: AC
  Administered 2023-01-17: 500 mg via INTRAVENOUS
  Filled 2023-01-17: qty 25

## 2023-01-17 NOTE — Lactation Note (Signed)
This note was copied from a baby's chart.  NICU Lactation Consultation Note  Patient Name: Girl Lionor Hilde [id number] Date: 01/17/2023 Age:45 hours  Reason for consult: Follow-up assessment; NICU baby; Primapara; 1st time breastfeeding; Early term 37-38.6wks  SUBJECTIVE  LC in to visit with P1 Mom of ET infant receiving respiratory support (CPAP) in the NICU.  Mom states she just finished pumping and has been consistently pumping every 3 hrs, except when she is sleeping.  Hand's free pumping band provided and encouraged Mom to do hand's on pumping to stimulate her milk support.  Mom isn't seeing any drops yet.  Reviewed breast massage and hand expression, no drops noted at present.  Encouraged holding baby STS when she is in the NICU.   No further questions presently.  OBJECTIVE Infant data: Mother's Current Feeding Choice: Breast Milk and Formula  Infant feeding assessment Scale for Readiness: 5 Scale for Quality: 3   Maternal data: G2P0010  C-Section, Low Transverse Has patient been taught Hand Expression?: Yes Hand Expression Comments: glistening on nipple Significant Breast History:: little breast changes Current breast feeding challenges:: Infant separation Does the patient have breastfeeding experience prior to this delivery?: No Pumping frequency: Every 3 hrs, except at night when sleeping.  Encouraged 8 pumpings in 24 hrs. Pumped volume: 0 mL Flange Size: 21 Risk factor for low milk supply:: Infant separation  WIC Program: No WIC Referral Sent?: No Pump: Personal, Hands Free (MomCozy from insurance)  ASSESSMENT Infant: Feeding Status: Scheduled 9-12-3-6  Maternal: Milk volume: Normal  INTERVENTIONS/PLAN Interventions: Interventions: Breast feeding basics reviewed; Skin to skin; Hand express; Breast massage; DEBP; LC Services brochure Tools: Pump; Flanges; Hands-free pumping top Pump Education: Setup, frequency, and cleaning; Milk Storage  Plan: Consult  Status: NICU follow-up NICU Follow-up type: New admission follow up; Verify onset of copious milk; Verify absence of engorgement   Judee Clara 01/17/2023, 2:28 PM

## 2023-01-17 NOTE — Progress Notes (Signed)
SVD: {findings; c - section delivery reason:31449}  S:  Pt reports feeling ***/ Tolerating po/ Voiding without problems/ No n/v/ Bleeding is {Description; bleeding vaginal:11356}/ Pain controlled with{treatments; pain control med:13496}    O:  A & O x 3 ***/ VS: Blood pressure 112/62, pulse 83, temperature 98.6 F (37 C), temperature source Oral, resp. rate 20, height 5\' 6"  (1.676 m), weight 112.6 kg, SpO2 99 %.  LABS:  Results for orders placed or performed during the hospital encounter of 01/16/23 (from the past 24 hour(s))  CBC     Status: Abnormal   Collection Time: 01/17/23  5:18 AM  Result Value Ref Range   WBC 14.5 (H) 4.0 - 10.5 K/uL   RBC 3.02 (L) 3.87 - 5.11 MIL/uL   Hemoglobin 8.7 (L) 12.0 - 15.0 g/dL   HCT 16.1 (L) 09.6 - 04.5 %   MCV 86.1 80.0 - 100.0 fL   MCH 28.8 26.0 - 34.0 pg   MCHC 33.5 30.0 - 36.0 g/dL   RDW 40.9 81.1 - 91.4 %   Platelets 202 150 - 400 K/uL   nRBC 0.0 0.0 - 0.2 %    I&O: I/O last 3 completed shifts: In: 3490 [P.O.:240; I.V.:3000; Blood:250] Out: 2009 [Urine:1300; Blood:709]   No intake/output data recorded.  Lungs: {Exam; lungs:5033}  Heart: {Exam; heart:5510}  Abdomen: {PE ABDOMEN POSTPARTUM OBGYN:313106}  Perineum: {exam; perineum:10172}  Lochia: ***  Extremities:{pe extremities NW:295621}    A/P: POD # ***/PPD # ***/ G2P0010  Doing well  Continue routine post partum orders  ***

## 2023-01-17 NOTE — Progress Notes (Signed)
MD Cousins called by this nurse and made aware that pt was unable to tolerate burning from the Iron. Infusion was stopped.

## 2023-01-18 MED ORDER — POLYSACCHARIDE IRON COMPLEX 150 MG PO CAPS
150.0000 mg | ORAL_CAPSULE | Freq: Every day | ORAL | Status: DC
  Administered 2023-01-18 – 2023-01-19 (×2): 150 mg via ORAL
  Filled 2023-01-18 (×2): qty 1

## 2023-01-18 MED ORDER — FUROSEMIDE 20 MG PO TABS
20.0000 mg | ORAL_TABLET | Freq: Every day | ORAL | Status: DC
  Administered 2023-01-18 – 2023-01-19 (×2): 20 mg via ORAL
  Filled 2023-01-18 (×2): qty 1

## 2023-01-18 NOTE — Progress Notes (Signed)
SVD: primary  S:  Pt reports feeling well except legs are tight due to swelling / Tolerating po/ Voiding without problems/ No n/v/ Bleeding is light/ Pain controlled withacetaminophen and prescription NSAID's including ibuprofen (Motrin)    O:  A & O x 3 / VS: Blood pressure 123/80, pulse 71, temperature 98.7 F (37.1 C), temperature source Oral, resp. rate 18, height 5\' 6"  (1.676 m), weight 112.6 kg, SpO2 99 %.  LABS: No results found for this or any previous visit (from the past 24 hour(s)).  I&O: I/O last 3 completed shifts: In: -  Out: 750 [Urine:750]   No intake/output data recorded.  Lungs: chest clear, no wheezing, rales, normal symmetric air entry  Heart: regular rate and rhythm, S1, S2 normal, no murmur, click, rub or gallop  Abdomen: soft uterus firm . Incision: d/c/i  Perineum: not inspected  Lochia: light  Extremities:no redness or tenderness in the calves or thighs, edema 1+    A/P: POD # 2/PPD # 2/ [id number] Peripheral edema Anemia  Doing well  Continue routine post partum orders  Lasix  20 mg po qd Anticipate d/c in am

## 2023-01-18 NOTE — Lactation Note (Signed)
This note was copied from a baby's chart.  NICU Lactation Consultation Note  Patient Name: Tara Tate [id number] Date: 01/18/2023 Age:47 years  Reason for consult: Follow-up assessment; 1st time breastfeeding; Primapara; NICU baby; Early term 94-38.6wks; Other (Comment) (AMA, IVF)  SUBJECTIVE Visited with family of 32 hours old ETI NICU female; Ms. Matts is a P1 and reports she's pumping but nothing comes out. Explained that the importance of pumping this early on is mainly for breast stimulation and not to get volumes. Noticed that she's not consistently. Reviewed the importance of consistent pumping for the onset of lactogenesis II and to protect her supply, she voiced understanding. Revised pumping schedule, lactogenesis II and anticipatory guidelines.  OBJECTIVE Infant data: Mother's Current Feeding Choice: Breast Milk and Formula  Infant feeding assessment Scale for Readiness: 5 Scale for Quality: 3   Maternal data: G2P0010  C-Section, Low Transverse Current breast feeding challenges:: NICU admission Pumping frequency: 3 times/24 hours Pumped volume: 0 mL Flange Size: 21 Risk factor for low milk supply:: AMA, IFV, primipara, infant separation  WIC Program: No WIC Referral Sent?: No Pump: Personal, Hands Free (MomCozy from insurance)  ASSESSMENT Infant: Feeding Status: Scheduled 9-12-3-6  Maternal: Milk volume: Normal  INTERVENTIONS/PLAN Interventions: Interventions: Breast feeding basics reviewed; DEBP; Education Tools: Pump; Hands-free pumping top; Flanges; Coconut oil Pump Education: Setup, frequency, and cleaning; Milk Storage  Plan: Encouraged pumping every 3 hours, ideally 8 pumping sessions/24 hours Breast massage, hand expression and coconut oil were also encouraged prior pumping  FOB present. All questions and concerns answered, family to contact [facility] services PRN.  Consult Status: NICU follow-up NICU Follow-up type: Maternal D/C visit   Kaiesha Tonner  Venetia Constable 01/18/2023, 1:55 PM

## 2023-01-19 MED ORDER — IBUPROFEN 600 MG PO TABS: 600.0000 mg | ORAL_TABLET | Freq: Four times a day (QID) | ORAL | 11 refills | Status: AC | PRN

## 2023-01-19 MED ORDER — OXYCODONE HCL 5 MG PO TABS: 5.0000 mg | ORAL_TABLET | ORAL | 0 refills | Status: AC | PRN

## 2023-01-19 MED ORDER — POLYSACCHARIDE IRON COMPLEX 150 MG PO CAPS: 150.0000 mg | ORAL_CAPSULE | Freq: Every day | ORAL | 11 refills | Status: AC

## 2023-01-19 MED ORDER — FUROSEMIDE 20 MG PO TABS: 20.0000 mg | ORAL_TABLET | Freq: Every day | ORAL | 0 refills | Status: AC

## 2023-01-19 NOTE — Discharge Instructions (Signed)
Call if temperature greater than equal to 100.4, nothing per vagina for 4-6 weeks or severe nausea vomiting, increased incisional pain , drainage or redness in the incision site, no straining with bowel movements, showers no bath °

## 2023-01-19 NOTE — Progress Notes (Signed)
SVD: primary  S:  Pt reports feeling well/ Tolerating po/Voiding without problems/ No n/v/ Bleeding is light/ Pain controlled withacetaminophen and prescription NSAID's including motrin    O:  A & O x 3 well/ VS: Blood pressure 124/74, pulse 81, temperature 98.2 F (36.8 C), temperature source Oral, resp. rate 18, height 5\' 6"  (1.676 m), weight 112.6 kg, SpO2 99 %.  LABS: No results found for this or any previous visit (from the past 24 hour(s)).  I&O: No intake/output data recorded.   No intake/output data recorded.  Lungs: chest clear, no wheezing, rales, normal symmetric air entry  Heart: regular rate and rhythm, S1, S2 normal, no murmur, click, rub or gallop  Abdomen: soft uterus firm  Perineum: not inspected  Lochia: light  Extremities:no redness or tenderness in the calves or thighs, edema 1+    A/P: POD # 3/PPD # 3/ [id number] Peripheral edema Anemia  Doing well  Continue routine post partum orders  D/c instructions reviewed F/u 6 wk Continue lasix. Oral iron supplement

## 2023-01-19 NOTE — Lactation Note (Signed)
This note was copied from a baby's chart.  NICU Lactation Consultation Note  Patient Name: Girl Ted Alejandro [id number] Date: 01/19/2023 Age:45 hours  Reason for consult: Follow-up assessment; NICU baby; Primapara; 1st time breastfeeding; Early term 37-38.6wks; Other (Comment) (AMA, IVF)  SUBJECTIVE  LC in to visit with P1 Mom in baby's room in the NICU. Baby continues on HFNC and gavage feeds. Mom is planning for discharge today and is going to stay in baby's room.  Mom aware of Medela [employer] DEBP available for her use and instructed her to bring all the pump parts, including those under the lid to baby's room.  Mom states she is pumping every 3 hrs, and is disappointed that she is collecting anything.  Reassured her and encouraged her to do as much STS as she can, massaging breasts and hand expression while STS, always pumping after STS.  Engorgement prevention and treatment reviewed. Mom aware of lactation support and encouraged her to ask for help prn.  OBJECTIVE Infant data: Mother's Current Feeding Choice: Breast Milk and Formula  Infant feeding assessment Scale for Readiness: 2 (HFNC 4L)   Maternal data: G2P0010  C-Section, Low Transverse Current breast feeding challenges:: NICU admission Pumping frequency: Every 3 hrs Pumped volume: 0 mL Flange Size: 21 Risk factor for low milk supply:: AMA, IFV, primipara, infant separation  WIC Program: No WIC Referral Sent?: No Pump: Personal, Hands Free (MomCozy from insurance)  ASSESSMENT Infant: Feeding Status: Scheduled 9-12-3-6  Maternal: Milk volume: Normal  INTERVENTIONS/PLAN Interventions: Interventions: Breast feeding basics reviewed; DEBP; Education Discharge Education: Engorgement and breast care Tools: Pump; Flanges; Hands-free pumping top Pump Education: Setup, frequency, and cleaning; Milk Storage  Plan: 1- STS as much as possible 2- Breast massage and hand expression before/after double pumping 3-  Continue consistent pumping every 2-3 hrs/day and 3-4 hrs at night. 4- ask for lactation assistance prn.  Consult Status: NICU follow-up NICU Follow-up type: Verify onset of copious milk; Verify absence of engorgement; Maternal D/C visit   Judee Clara 01/19/2023, 12:58 PM

## 2023-01-19 NOTE — Progress Notes (Signed)
Patient screened out for psychosocial assessment since none of the following apply:  Psychosocial stressors documented in mother or baby's chart  Gestation less than 32 weeks  Code at delivery   Infant with anomalies Please contact the Clinical Social Worker if specific needs arise, by MOB's request, or if MOB scores greater than 9/yes to question 10 on Edinburgh Postpartum Depression Screen.  Lekeith Wulf, LCSW Clinical Social Worker [facility] Cell#: [phone]     

## 2023-01-19 NOTE — Discharge Summary (Signed)
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 yo G2P0010 at [redacted]w[redacted]d was admitted to the hospital 01/16/2023 for scheduled cesarean section with the following indication:Elective Primary.Delivery details are as follows:  Membrane Rupture Time/Date: 10:58 AM ,01/16/2023   Delivery Method:C-Section, Low Transverse  Details of operation can be found in separate operative note.  Patient had a postpartum course complicated by iron deficiency anemia exacerbated by blood loss. IV iron infusion was ordered but not tolerated.  She is ambulating, tolerating a regular diet, passing flatus, and urinating well. Patient is discharged home in stable condition on  01/19/2023        Newborn Data: Birth date:01/16/2023  Birth time:10:59 AM  Gender:Female  Living status:Living  Apgars:7 ,8  Weight:3.31 kg     Magnesium Sulfate received: No BMZ received: No Rhophylac:N/A MMR:N/A T-DaP:Given prenatally Flu: No Transfusion:No  Physical exam  Vitals:   01/17/23 2138 01/18/23 0616 01/18/23 2313 01/19/23 1416  BP: 126/77 123/80 120/71 124/74  Pulse: 84 71 80 81  Resp: 18 18  18 18   Temp: 98.7 F (37.1 C)  98.4 F (36.9 C) 98.2 F (36.8 C)  TempSrc: Oral  Oral Oral  SpO2: 98% 99% 99% 99%  Weight:      Height:       General: alert, cooperative, and no distress Lochia: appropriate Uterine Fundus: firm Incision: Dressing is clean, dry, and intact DVT Evaluation: No evidence of DVT seen on physical exam. Calf/Ankle edema is present Labs: Lab Results  Component Value Date   WBC 14.5 (H) 01/17/2023   HGB 8.7 (L) 01/17/2023   HCT 26.0 (L) 01/17/2023   MCV 86.1 01/17/2023   PLT 202 01/17/2023       No data to display         [place] Score:    01/16/2023    1:20 PM  Edinburgh Postnatal Depression Scale Screening Tool  I have been able to laugh and see the funny side of things. 0  I have looked forward with enjoyment to things. 0  I have blamed myself unnecessarily when things went wrong. 1  I have been anxious or worried for no good reason. 0  I have felt scared or panicky for no good reason. 0  Things have been getting on top of me. 1  I have been so unhappy that I have had difficulty sleeping. 0  I have felt sad or miserable. 0  I have been so unhappy that I have been crying. 0  The thought of harming myself has occurred to me.  0  Edinburgh Postnatal Depression Scale Total 2      After visit meds:     Discharge home in stable condition Infant Feeding: Breast Infant Disposition:NICU Discharge instruction: per After Visit Summary and Postpartum booklet. Activity: Advance as tolerated. Pelvic rest for 6 weeks.  Diet: iron rich diet Anticipated Birth Control: Unsure Postpartum Appointment:6 weeks Additional Postpartum F/U:  n/a Future Appointments:No future appointments. Follow up Visit:  Follow-up Information     Maxie Better, MD Follow up in 6 week(s).   Specialty: Obstetrics and Gynecology Contact information: [address] [place] [address] [place] [address] [phone]                      01/19/2023 Serita Kyle, MD

## 2023-01-20 ENCOUNTER — Ambulatory Visit (HOSPITAL_COMMUNITY): Payer: Self-pay

## 2023-01-20 NOTE — Lactation Note (Signed)
This note was copied from a baby's chart.  NICU Lactation Consultation Note  Patient Name: Tara Tate [id number] Date: 01/20/2023 Age:45 days  Reason for consult: Follow-up assessment; Breastfeeding assistance; RN request; Early term 29-38.6wks; NICU baby; Other (Comment) (AMA, IVF)  SUBJECTIVE  RN called for assist with baby's first breastfeeding.  Mom has been consistently pumping, no volume expressed at present.  Baby sucking vigorously on pacifier.  Placed baby "Tara Tate" STS in football hold and then assisted with cross cradle.  Tried with a nipple shield (20mm) as baby was rooting, but unable to obtain/sustain latch.  Tara Tate SLP in attendance as well and provided formula drops on nipple shield, but baby remained fussy and unable to settle down to organize herself.  Baby placed STS on Mom's chest and was easily comforted.  Mom understands that baby needs to be ready and to continue watching for feeding cues.    Mom hasn't been able to express any milk, breasts are feeling fuller on the sides of her breast.  No engorgement noted.   OBJECTIVE Infant data: No data recorded Infant feeding assessment Scale for Readiness: 2 Scale for Quality: 3   Maternal data: G2P0010  C-Section, Low Transverse Pumping frequency: Consistent 8 times per 24 hrs Pumped volume: 0 mL Flange Size: 21  WIC Program: No WIC Referral Sent?: No Pump: Personal, Hands Free (MomCozy from insurance)  ASSESSMENT Infant: LATCH Documentation Latch: 0 Audible Swallowing: 0 Type of Nipple: 2 (short nipple shafts, areola slightly non-compressible) Comfort (Breast/Nipple): 2 Hold (Positioning): 0 LATCH Score: 4  Feeding Status: Scheduled 9-12-3-6  Maternal: Milk volume: Normal (drops)  INTERVENTIONS/PLAN Interventions: Interventions: Assisted with latch; Skin to skin; Breast massage; Hand express; Breast compression; Adjust position; Support pillows; Position options; Expressed milk; DEBP;  Education Discharge Education: Engorgement and breast care Tools: Pump; Flanges; Nipple Dorris Carnes; Bottle; Hands-free pumping top Pump Education: Setup, frequency, and cleaning; Milk Storage Nipple shield size: 20  Plan:  1- STS with baby as much as possible 2- Offer the breast with cues after hand expressing a drop onto nipple. 3- Use nipple shield to help with latch prn 4-Mom to continue consistent pumping 8 times per 24 hrs.  Consult Status: NICU follow-up NICU Follow-up type: Verify onset of copious milk; Verify absence of engorgement   Judee Clara 01/20/2023, 12:01 PM

## 2023-01-23 MED FILL — Sodium Chloride IV Soln 0.9%: INTRAVENOUS | Qty: 2000 | Status: AC

## 2023-01-23 MED FILL — Heparin Sodium (Porcine) Inj 1000 Unit/ML: INTRAMUSCULAR | Qty: 30 | Status: AC

## 2023-01-25 ENCOUNTER — Ambulatory Visit (HOSPITAL_COMMUNITY): Payer: Self-pay

## 2023-01-25 NOTE — Lactation Note (Signed)
This note was copied from a baby's chart.  NICU Lactation Consultation Note  Patient Name: Tara Tate [id number]'U Date: 01/25/2023 Age:45 days  Reason for consult: Weekly NICU follow-up; Early term 37-38.6wks; Primapara; 1st time breastfeeding; Other (Comment); Exclusive pumping and bottle feeding (AMA, IVF)  SUBJECTIVE Visited with family of 21 days old ETI NICU female; Tara Tate is a P1 and reports she's pumping consistently but her volumes have not increased as she was hoping; although she pumps enough to keep up with most of baby's feedings. She hasn't been using the NS # 20 neither taking baby to breast; she voiced she'll most likely be pumping and bottle feeding after baby's discharge. Parents are taking baby "Tara Tate" home today. Reviewed discharge education, strategies to increase supply and anticipatory guidelines. Referral to [facility]. sent per parental request. FOB present. All questions and concerns answered, family to contact [facility] services PRN.  OBJECTIVE Infant data: Mother's Current Feeding Choice: Breast Milk and Formula  Infant feeding assessment Scale for Readiness: 1 Scale for Quality: 1   Maternal data: G2P0010  C-Section, Low Transverse Pumping frequency: 6 times/24 hours Pumped volume: 60 mL Flange Size: 21  WIC Program: No WIC Referral Sent?: No Pump: Personal, Hands Free (MomCozy from insurance)  ASSESSMENT Infant: Feeding Status: Ad lib  Maternal: Milk volume: Normal  INTERVENTIONS/PLAN Interventions: Interventions: Breast feeding basics reviewed; DEBP; Education Discharge Education: Outpatient recommendation; Outpatient Epic message sent Tools: Pump; Flanges; Hands-free pumping top Pump Education: Setup, frequency, and cleaning; Milk Storage  Plan: Consult Status: Complete   Yaritzy Huser S Amorina Doerr 01/25/2023, 12:22 PM

## 2023-01-26 ENCOUNTER — Telehealth (HOSPITAL_COMMUNITY): Payer: Self-pay | Admitting: *Deleted

## 2023-01-26 NOTE — Telephone Encounter (Signed)
Mom is presently at peds appt. Will return nurse call if any questions or concerns.  Duffy Rhody, RN 01-26-2023 at 12:47pm

## 2023-01-27 ENCOUNTER — Encounter (HOSPITAL_COMMUNITY): Payer: Self-pay | Admitting: Obstetrics and Gynecology

## 2023-02-06 ENCOUNTER — Inpatient Hospital Stay (HOSPITAL_COMMUNITY): Admission: AD | Admit: 2023-02-06 | Payer: BC Managed Care – PPO | Source: Home / Self Care

## 2023-03-13 DIAGNOSIS — M654 Radial styloid tenosynovitis [de Quervain]: Secondary | ICD-10-CM | POA: Diagnosis not present

## 2023-04-06 DIAGNOSIS — E669 Obesity, unspecified: Secondary | ICD-10-CM | POA: Diagnosis not present

## 2023-05-14 DIAGNOSIS — K449 Diaphragmatic hernia without obstruction or gangrene: Secondary | ICD-10-CM | POA: Diagnosis not present

## 2023-05-14 DIAGNOSIS — K625 Hemorrhage of anus and rectum: Secondary | ICD-10-CM | POA: Diagnosis not present

## 2023-05-14 DIAGNOSIS — Z1211 Encounter for screening for malignant neoplasm of colon: Secondary | ICD-10-CM | POA: Diagnosis not present

## 2023-05-22 DIAGNOSIS — K648 Other hemorrhoids: Secondary | ICD-10-CM | POA: Diagnosis not present

## 2023-05-22 DIAGNOSIS — Z1211 Encounter for screening for malignant neoplasm of colon: Secondary | ICD-10-CM | POA: Diagnosis not present

## 2023-05-27 DIAGNOSIS — R1031 Right lower quadrant pain: Secondary | ICD-10-CM | POA: Diagnosis not present

## 2023-05-27 DIAGNOSIS — D251 Intramural leiomyoma of uterus: Secondary | ICD-10-CM | POA: Diagnosis not present

## 2023-07-20 DIAGNOSIS — Z01419 Encounter for gynecological examination (general) (routine) without abnormal findings: Secondary | ICD-10-CM | POA: Diagnosis not present

## 2023-08-06 DIAGNOSIS — N6325 Unspecified lump in the left breast, overlapping quadrants: Secondary | ICD-10-CM | POA: Diagnosis not present

## 2023-08-06 DIAGNOSIS — N6321 Unspecified lump in the left breast, upper outer quadrant: Secondary | ICD-10-CM | POA: Diagnosis not present

## 2023-08-18 ENCOUNTER — Other Ambulatory Visit: Payer: Self-pay

## 2023-08-18 DIAGNOSIS — R590 Localized enlarged lymph nodes: Secondary | ICD-10-CM | POA: Diagnosis not present

## 2023-08-18 DIAGNOSIS — N6332 Unspecified lump in axillary tail of the left breast: Secondary | ICD-10-CM | POA: Diagnosis not present

## 2023-08-20 LAB — SURGICAL PATHOLOGY

## 2023-10-07 DIAGNOSIS — M5451 Vertebrogenic low back pain: Secondary | ICD-10-CM | POA: Diagnosis not present

## 2023-10-07 DIAGNOSIS — M4316 Spondylolisthesis, lumbar region: Secondary | ICD-10-CM | POA: Diagnosis not present

## 2023-10-07 DIAGNOSIS — M47816 Spondylosis without myelopathy or radiculopathy, lumbar region: Secondary | ICD-10-CM | POA: Diagnosis not present

## 2023-10-26 DIAGNOSIS — M47896 Other spondylosis, lumbar region: Secondary | ICD-10-CM | POA: Diagnosis not present

## 2023-10-26 DIAGNOSIS — M4316 Spondylolisthesis, lumbar region: Secondary | ICD-10-CM | POA: Diagnosis not present

## 2023-10-26 DIAGNOSIS — M5451 Vertebrogenic low back pain: Secondary | ICD-10-CM | POA: Diagnosis not present

## 2023-12-15 DIAGNOSIS — B078 Other viral warts: Secondary | ICD-10-CM | POA: Diagnosis not present

## 2024-04-27 DIAGNOSIS — B078 Other viral warts: Secondary | ICD-10-CM | POA: Diagnosis not present

## 2024-08-01 DIAGNOSIS — L309 Dermatitis, unspecified: Secondary | ICD-10-CM | POA: Diagnosis not present

## 2024-08-01 DIAGNOSIS — B379 Candidiasis, unspecified: Secondary | ICD-10-CM | POA: Diagnosis not present
